# Patient Record
Sex: Male | Born: 1960 | Race: White | Hispanic: No | Marital: Single
Health system: Midwestern US, Academic
[De-identification: ages and names within clinical notes are randomized; demographics above are authoritative.]

---

## 2021-01-21 ENCOUNTER — Encounter: Admit: 2021-01-21 | Discharge: 2021-01-21

## 2021-01-21 ENCOUNTER — Inpatient Hospital Stay: Admit: 2021-01-21

## 2021-01-21 ENCOUNTER — Inpatient Hospital Stay: Admit: 2021-01-21 | Discharge: 2021-01-21

## 2021-01-21 ENCOUNTER — Emergency Department: Admit: 2021-01-21 | Discharge: 2021-01-21

## 2021-01-21 DIAGNOSIS — F411 Generalized anxiety disorder: Secondary | ICD-10-CM

## 2021-01-21 DIAGNOSIS — I10 Essential (primary) hypertension: Secondary | ICD-10-CM

## 2021-01-21 DIAGNOSIS — F4310 Post-traumatic stress disorder, unspecified: Secondary | ICD-10-CM

## 2021-01-21 DIAGNOSIS — I4891 Unspecified atrial fibrillation: Secondary | ICD-10-CM

## 2021-01-21 DIAGNOSIS — R079 Chest pain, unspecified: Secondary | ICD-10-CM

## 2021-01-21 LAB — COMPREHENSIVE METABOLIC PANEL
ALBUMIN: 4.2 g/dL (ref 3.5–5.0)
ALK PHOSPHATASE: 171 U/L — ABNORMAL HIGH (ref 25–110)
ALT: 35 U/L (ref 7–56)
ANION GAP: 13 K/UL — ABNORMAL HIGH (ref 3–12)
AST: 32 U/L (ref 7–40)
BLD UREA NITROGEN: 7 mg/dL (ref 7–25)
CALCIUM: 9.8 mg/dL — ABNORMAL HIGH (ref 8.5–10.6)
CO2: 25 MMOL/L (ref 21–30)
CREATININE: 0.7 mg/dL — ABNORMAL HIGH (ref 0.4–1.24)
EGFR: >60 mL/min — ABNORMAL HIGH (ref >60)
GLUCOSE,PANEL: 115 mg/dL — ABNORMAL HIGH (ref 70–100)
POTASSIUM: 4.2 MMOL/L — ABNORMAL LOW (ref 3.5–5.1)
SODIUM: 145 MMOL/L — ABNORMAL LOW (ref 137–147)
TOTAL BILIRUBIN: 0.6 mg/dL (ref 0.3–1.2)
TOTAL PROTEIN: 7.9 g/dL (ref 6.0–8.0)

## 2021-01-21 LAB — CBC AND DIFF
ABSOLUTE BASO COUNT: 0.1 K/UL (ref 0–0.20)
ABSOLUTE EOS COUNT: 0 K/UL (ref 0–0.45)
MCV: 91 FL (ref 80–100)
MDW (MONOCYTE DISTRIBUTION WIDTH): 15 (ref <20.7)
RBC COUNT: 3.9 M/UL — ABNORMAL LOW (ref 4.4–5.5)
WBC COUNT: 8.5 K/UL (ref 4.5–11.0)

## 2021-01-21 LAB — BARBITURATES-URINE RANDOM: BARBITURATES: NEGATIVE

## 2021-01-21 LAB — TROPONIN-I: TROPONIN I: 0 ng/mL (ref 0.0–0.05)

## 2021-01-21 LAB — PTT (APTT)
PTT: 30 s (ref 24.0–36.5)
PTT: 50 s — ABNORMAL HIGH (ref 24.0–36.5)

## 2021-01-21 LAB — COCAINE-URINE RANDOM: COCAINE: NEGATIVE

## 2021-01-21 LAB — PROTIME INR (PT)
INR: 1 (ref 0.8–1.2)
PROTIME: 10 s (ref 9.5–14.2)

## 2021-01-21 LAB — BENZODIAZEPINES-URINE RANDOM: BENZODIAZEPINES: POSITIVE — AB

## 2021-01-21 LAB — AMPHETAMINES-URINE RANDOM: AMPHETAMINES: NEGATIVE

## 2021-01-21 LAB — POC TROPONIN
TROPONIN I, POC: 0 ng/mL (ref 0.00–0.05)
TROPONIN I, POC: 0 ng/mL (ref 0.00–0.05)

## 2021-01-21 LAB — CANNABINOIDS-URINE RANDOM: CANNABINOIDS (THC): NEGATIVE

## 2021-01-21 LAB — TSH WITH FREE T4 REFLEX: TSH: 1.6 uU/mL (ref >40)

## 2021-01-21 LAB — D-DIMER: D-DIMER: 154 ng{FEU}/mL — ABNORMAL HIGH (ref <500)

## 2021-01-21 LAB — BNP (B-TYPE NATRIURETIC PEPTI): BNP: 505 pg/mL — ABNORMAL HIGH (ref <150)

## 2021-01-21 LAB — HEMOGLOBIN A1C: HEMOGLOBIN A1C: 7.3 % — ABNORMAL HIGH (ref <100)

## 2021-01-21 LAB — OPIATES-URINE RANDOM: OPIATES: NEGATIVE

## 2021-01-21 LAB — PHENCYCLIDINES-URINE RANDOM: PHENCYCLIDINE (PCP): NEGATIVE

## 2021-01-21 LAB — FENTANYL URINE: FENTANYL URINE: POSITIVE — AB

## 2021-01-21 LAB — LIPID PROFILE
CHOLESTEROL: 220 mg/dL — ABNORMAL HIGH (ref <200)
NON HDL CHOLESTEROL: 150 mg/dL
VLDL: 16 mg/dL

## 2021-01-21 MED ORDER — DILTIAZEM BOLUS FOR CONTINUOUS INFUSION
15 mg | Freq: Once | INTRAVENOUS | 0 refills | Status: CP
Start: 2021-01-21 — End: ?

## 2021-01-21 MED ORDER — METOPROLOL TARTRATE 25 MG PO TAB
25 mg | Freq: Two times a day (BID) | ORAL | 0 refills | Status: DC
Start: 2021-01-21 — End: 2021-01-21

## 2021-01-21 MED ORDER — SENNOSIDES-DOCUSATE SODIUM 8.6-50 MG PO TAB
1 | Freq: Every day | ORAL | 0 refills | Status: AC | PRN
Start: 2021-01-21 — End: ?

## 2021-01-21 MED ORDER — LORAZEPAM 1 MG PO TAB
1 mg | Freq: Once | ORAL | 0 refills | Status: CP
Start: 2021-01-21 — End: ?
  Administered 2021-01-21: 17:00:00 1 mg via ORAL

## 2021-01-21 MED ORDER — SERTRALINE 25 MG PO TAB
50 mg | Freq: Every day | ORAL | 0 refills | Status: AC
Start: 2021-01-21 — End: ?
  Administered 2021-01-21 – 2021-01-23 (×3): 50 mg via ORAL

## 2021-01-21 MED ORDER — ONDANSETRON HCL (PF) 4 MG/2 ML IJ SOLN
4 mg | INTRAVENOUS | 0 refills | Status: AC | PRN
Start: 2021-01-21 — End: ?

## 2021-01-21 MED ORDER — HEPARIN (PORCINE) INITIAL BOLUS FOR CONTINUOUS INF (BAG)
4000 [IU] | Freq: Once | INTRAVENOUS | 0 refills | Status: CP
Start: 2021-01-21 — End: ?

## 2021-01-21 MED ORDER — NITROGLYCERIN 0.4 MG SL SUBL
.4 mg | SUBLINGUAL | 0 refills | Status: AC | PRN
Start: 2021-01-21 — End: ?

## 2021-01-21 MED ORDER — RP DX TC-99M MAA MCI
5 | Freq: Once | INTRAVENOUS | 0 refills | Status: CP
Start: 2021-01-21 — End: ?
  Administered 2021-01-21: 14:00:00 5.1 via INTRAVENOUS

## 2021-01-21 MED ORDER — HYDROXYZINE HCL 25 MG PO TAB
25 mg | Freq: Three times a day (TID) | ORAL | 0 refills | Status: AC | PRN
Start: 2021-01-21 — End: ?
  Administered 2021-01-22 (×2): 25 mg via ORAL

## 2021-01-21 MED ORDER — HEPARIN (PORCINE) IN 5 % DEX 20,000 UNIT/500 ML (40 UNIT/ML) IV SOLP
0-2000 [IU]/h | INTRAVENOUS | 0 refills | Status: AC
Start: 2021-01-21 — End: ?
  Administered 2021-01-21: 18:00:00 870 [IU]/h via INTRAVENOUS
  Administered 2021-01-22: 07:00:00 1070 [IU]/h via INTRAVENOUS

## 2021-01-21 MED ORDER — RP DX TL-201 THALLOUS CHL MCI
1.25 | Freq: Once | INTRAVENOUS | 0 refills | Status: CP
Start: 2021-01-21 — End: ?
  Administered 2021-01-21: 16:00:00 1.26 via INTRAVENOUS

## 2021-01-21 MED ORDER — ASPIRIN 81 MG PO CHEW
324 mg | Freq: Once | ORAL | 0 refills | Status: CP
Start: 2021-01-21 — End: ?
  Administered 2021-01-21: 09:00:00 324 mg via ORAL

## 2021-01-21 MED ORDER — DILTIAZEM HCL 60 MG PO TAB
30 mg | ORAL | 0 refills | Status: AC
Start: 2021-01-21 — End: ?
  Administered 2021-01-21 – 2021-01-23 (×8): 30 mg via ORAL

## 2021-01-21 MED ORDER — HEPARIN (PORCINE) BOLUS FOR CONTINUOUS INF (BAG)
20-40 [IU]/kg | INTRAVENOUS | 0 refills | Status: AC
Start: 2021-01-21 — End: ?

## 2021-01-21 MED ORDER — REGADENOSON 0.4 MG/5 ML IV SYRG
.4 mg | Freq: Once | INTRAVENOUS | 0 refills | Status: CP
Start: 2021-01-21 — End: ?
  Administered 2021-01-21: 16:00:00 0.4 mg via INTRAVENOUS

## 2021-01-21 MED ORDER — NITROGLYCERIN 0.4 MG SL SUBL
.4 mg | Freq: Once | SUBLINGUAL | 0 refills | Status: CP
Start: 2021-01-21 — End: ?
  Administered 2021-01-21: 11:00:00 0.4 mg via SUBLINGUAL

## 2021-01-21 MED ORDER — CLONIDINE HCL 0.1 MG PO TAB
.1 mg | ORAL | 0 refills | Status: AC
Start: 2021-01-21 — End: ?
  Administered 2021-01-21 – 2021-01-22 (×3): 0.1 mg via ORAL

## 2021-01-21 MED ORDER — MELATONIN 3 MG PO TAB
6 mg | Freq: Every evening | ORAL | 0 refills | Status: AC
Start: 2021-01-21 — End: ?
  Administered 2021-01-22 – 2021-01-23 (×2): 6 mg via ORAL

## 2021-01-21 MED ORDER — EUCALYPTUS-MENTHOL MM LOZG
1 | Freq: Once | ORAL | 0 refills | Status: AC | PRN
Start: 2021-01-21 — End: ?

## 2021-01-21 MED ORDER — AMINOPHYLLINE 500 MG/20 ML IV SOLN
50 mg | INTRAVENOUS | 0 refills | Status: AC | PRN
Start: 2021-01-21 — End: ?

## 2021-01-21 MED ORDER — CLONIDINE HCL 0.1 MG PO TAB
.1 mg | Freq: Two times a day (BID) | ORAL | 0 refills | Status: AC
Start: 2021-01-21 — End: ?
  Administered 2021-01-22: 18:00:00 0.1 mg via ORAL

## 2021-01-21 MED ORDER — GABAPENTIN 300 MG PO CAP
900 mg | ORAL | 0 refills | Status: AC
Start: 2021-01-21 — End: ?
  Administered 2021-01-21 – 2021-01-23 (×7): 900 mg via ORAL

## 2021-01-21 MED ORDER — RP DX TL-201 THALLOUS CHL MCI
.5 | Freq: Once | INTRAVENOUS | 0 refills | Status: CP
Start: 2021-01-21 — End: ?
  Administered 2021-01-21: 19:00:00 0.49 via INTRAVENOUS

## 2021-01-21 MED ORDER — METOPROLOL TARTRATE 5 MG/5 ML IV SOLN
5 mg | Freq: Once | INTRAVENOUS | 0 refills | Status: DC
Start: 2021-01-21 — End: 2021-01-21

## 2021-01-21 MED ORDER — GABAPENTIN 300 MG PO CAP
300 mg | ORAL | 0 refills | Status: AC
Start: 2021-01-21 — End: ?

## 2021-01-21 MED ORDER — LORAZEPAM 1 MG PO TAB
.5 mg | Freq: Once | ORAL | 0 refills | Status: CP
Start: 2021-01-21 — End: ?
  Administered 2021-01-21: 09:00:00 0.5 mg via ORAL

## 2021-01-21 MED ORDER — ONDANSETRON 4 MG PO TBDI
4 mg | ORAL | 0 refills | Status: AC | PRN
Start: 2021-01-21 — End: ?

## 2021-01-21 MED ORDER — SODIUM CHLORIDE 0.9 % IV SOLP
250 mL | INTRAVENOUS | 0 refills | Status: AC | PRN
Start: 2021-01-21 — End: ?

## 2021-01-21 MED ORDER — POLYETHYLENE GLYCOL 3350 17 GRAM PO PWPK
1 | Freq: Every day | ORAL | 0 refills | Status: AC | PRN
Start: 2021-01-21 — End: ?

## 2021-01-21 MED ORDER — CLONIDINE HCL 0.1 MG PO TAB
.05 mg | Freq: Two times a day (BID) | ORAL | 0 refills | Status: AC
Start: 2021-01-21 — End: ?

## 2021-01-21 MED ORDER — ASPIRIN 325 MG PO TAB
325 mg | Freq: Once | ORAL | 0 refills | Status: DC
Start: 2021-01-21 — End: 2021-01-21

## 2021-01-21 MED ORDER — GABAPENTIN 300 MG PO CAP
600 mg | ORAL | 0 refills | Status: AC
Start: 2021-01-21 — End: ?

## 2021-01-21 MED ORDER — ALBUTEROL SULFATE 90 MCG/ACTUATION IN HFAA
2 | RESPIRATORY_TRACT | 0 refills | Status: AC | PRN
Start: 2021-01-21 — End: ?

## 2021-01-21 MED ORDER — THIAMINE MONONITRATE (VIT B1) 100 MG PO TAB
100 mg | Freq: Every day | ORAL | 0 refills | Status: AC
Start: 2021-01-21 — End: ?
  Administered 2021-01-21 – 2021-01-23 (×3): 100 mg via ORAL

## 2021-01-21 MED ORDER — MELATONIN 5 MG PO TAB
5 mg | Freq: Every evening | ORAL | 0 refills | Status: AC | PRN
Start: 2021-01-21 — End: ?

## 2021-01-21 MED ORDER — LORAZEPAM 1 MG PO TAB
.5 mg | Freq: Once | ORAL | 0 refills | Status: CP
Start: 2021-01-21 — End: ?
  Administered 2021-01-21: 10:00:00 0.5 mg via ORAL

## 2021-01-21 MED ORDER — FENTANYL CITRATE (PF) 50 MCG/ML IJ SOLN
50 ug | Freq: Once | INTRAVENOUS | 0 refills | Status: CP
Start: 2021-01-21 — End: ?

## 2021-01-21 MED ADMIN — FENTANYL CITRATE (PF) 50 MCG/ML IJ SOLN [3037]: 50 ug | INTRAVENOUS | @ 10:00:00 | Stop: 2021-01-21 | NDC 00409909412

## 2021-01-21 NOTE — ED Notes
Pt called RN d/t having chest pain. Pt stated he was having non-radiating substernal chest pain, 7/10. Denies SOB and nausea.

## 2021-01-21 NOTE — Consults
Inpatient Psychiatric Clinical Nurse Specialist- Initial Consult  Pt. Name: [name]  Room: GQ9169/45  LOS: 0      Reason for consult:  coping    Interventions that would be helpful: adding prozosin 1mg  HS to help him with his PTSD nightmares.  He went to an Alcohol detox program but didn't finish with the therapy. He told me he would go back and finish.  This service is not able to enter orders. Primary team is responsible for entering orders.      Summary   Patient is a 60 year old male with PMH of PTSD, HTN and hodgkin's lymphoma (s/p treatment x 2) who presents for evaluation of worsening anxiety. While in the ER he developed acute substernal chest pain associated with shortness of breath.    MSE: Patient alert and Ox3. Speech fluid. Thoughts lucid and w/o evidence of psychoses. Memory grossly intact. Patient cooperative and with appropriate eye contact. Mood calm with congruent affect. Insight, judgment, and impulse control sufficient. No SI/HI, plans, or intent endorsed at this time.     Atarax 25 mg TID prn anxiety  melatonin 5 mg HS prn  Sertraline 50 mg daily    I appreciate being invited to participate in this patient's care. Call or page if there are any concerns or questions.     [address] [facility]   [facility]  Office [phone]

## 2021-01-21 NOTE — ED Notes
Pt continues to have substernal chest pain. MD notified and verbal order given.
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
## 2021-01-21 NOTE — ED Notes
Internal medicine at bedside

## 2021-01-22 ENCOUNTER — Inpatient Hospital Stay: Admit: 2021-01-22 | Discharge: 2021-01-22

## 2021-01-22 ENCOUNTER — Encounter: Admit: 2021-01-22 | Discharge: 2021-01-22

## 2021-01-22 DIAGNOSIS — F4310 Post-traumatic stress disorder, unspecified: Secondary | ICD-10-CM

## 2021-01-22 DIAGNOSIS — I10 Essential (primary) hypertension: Secondary | ICD-10-CM

## 2021-01-22 DIAGNOSIS — I4891 Unspecified atrial fibrillation: Secondary | ICD-10-CM

## 2021-01-22 MED ORDER — PERFLUTREN LIPID MICROSPHERES 1.1 MG/ML IV SUSP
1-10 mL | Freq: Once | INTRAVENOUS | 0 refills | Status: CP | PRN
Start: 2021-01-22 — End: ?
  Administered 2021-01-22: 14:00:00 2 mL via INTRAVENOUS

## 2021-01-22 MED ADMIN — MAGNESIUM SULFATE IN D5W 1 GRAM/100 ML IV PGBK [166578]: 1 g | INTRAVENOUS | @ 16:00:00 | Stop: 2021-01-22 | NDC 00338170940

## 2021-01-22 MED ADMIN — MAGNESIUM SULFATE IN D5W 1 GRAM/100 ML IV PGBK [166578]: 1 g | INTRAVENOUS | @ 18:00:00 | Stop: 2021-01-22 | NDC 00338170940

## 2021-01-22 MED ADMIN — MAGNESIUM SULFATE IN D5W 1 GRAM/100 ML IV PGBK [166578]: 1 g | INTRAVENOUS | @ 17:00:00 | Stop: 2021-01-22 | NDC 00338170940

## 2021-01-22 MED ADMIN — ATORVASTATIN 40 MG PO TAB [77113]: 40 mg | ORAL | @ 20:00:00 | NDC 00904629261

## 2021-01-22 MED ADMIN — FUROSEMIDE 40 MG PO TAB [3295]: 40 mg | ORAL | @ 17:00:00 | NDC 51079007301

## 2021-01-23 ENCOUNTER — Encounter: Admit: 2021-01-23 | Discharge: 2021-01-23 | Payer: BC Managed Care – PPO

## 2021-01-23 MED ADMIN — PRAZOSIN 1 MG PO CAP [6468]: 1 mg | ORAL | @ 02:00:00 | NDC 00904702061

## 2021-01-23 MED ADMIN — ATORVASTATIN 40 MG PO TAB [77113]: 40 mg | ORAL | @ 13:00:00 | Stop: 2021-01-23 | NDC 00904629261

## 2021-01-23 MED FILL — SERTRALINE 50 MG PO TAB: 50 mg | ORAL | 90 days supply | Qty: 90 | Fill #1 | Status: CP

## 2021-01-23 MED FILL — FUROSEMIDE 20 MG PO TAB: 20 mg | 30 days supply | Qty: 30 | Fill #1 | Status: CP

## 2021-01-23 MED FILL — ATORVASTATIN 40 MG PO TAB: 40 mg | ORAL | 90 days supply | Qty: 90 | Fill #1 | Status: CP

## 2021-01-23 MED FILL — PRAZOSIN 1 MG PO CAP: 1 mg | ORAL | 30 days supply | Qty: 60 | Fill #1 | Status: CP

## 2021-01-23 MED FILL — GABAPENTIN 300 MG PO CAP: 300 mg | ORAL | 8 days supply | Qty: 45 | Fill #1 | Status: CP

## 2021-01-23 MED FILL — POTASSIUM CHLORIDE 10 MEQ PO TBTQ: 10 mEq | ORAL | 15 days supply | Qty: 30 | Fill #1 | Status: CP

## 2021-01-23 MED FILL — DILTIAZEM HCL 120 MG PO CP24: 120 mg | ORAL | 90 days supply | Qty: 90 | Fill #1 | Status: CP

## 2021-01-23 MED FILL — HYDROXYZINE HCL 25 MG PO TAB: 25 mg | ORAL | 30 days supply | Qty: 90 | Fill #1 | Status: CP

## 2021-01-30 ENCOUNTER — Encounter: Admit: 2021-01-30 | Discharge: 2021-01-30 | Payer: BC Managed Care – PPO

## 2021-02-02 ENCOUNTER — Encounter: Admit: 2021-02-02 | Discharge: 2021-02-02 | Payer: BC Managed Care – PPO

## 2021-02-02 NOTE — Progress Notes
2nd ER visit in 48 hours to [facility].   Pt presents with cp and has radiation to left arm.   Trop neg. Repeat will be in 3 hrs.   Hx afib and was not getting 120 extended release cardizem at rehab. Given in ER.    Pt was in ETOH rehab facility   5/15 [facility] hospital admit  Hr 96, 97%RA Vitals stable    Dr. Iver Nestle will call Darlene after looking at pt records and reviewing EKG that Darlene will send to him    Update:   Pt will be sent back to rehab center.

## 2022-06-19 IMAGING — CR [ID]
1 series · 1 of 1 positions shown · non-contrast
Comparison: none

02/02/21

PROCEDURE: UTSYW
HISTORY: Chest pain. Previous CXR 01/30/2021. TB
Comparison is made with a prior chest x-ray of 01/30/21.
This is a 1 view portable AP upright of the chest

[chest ap]
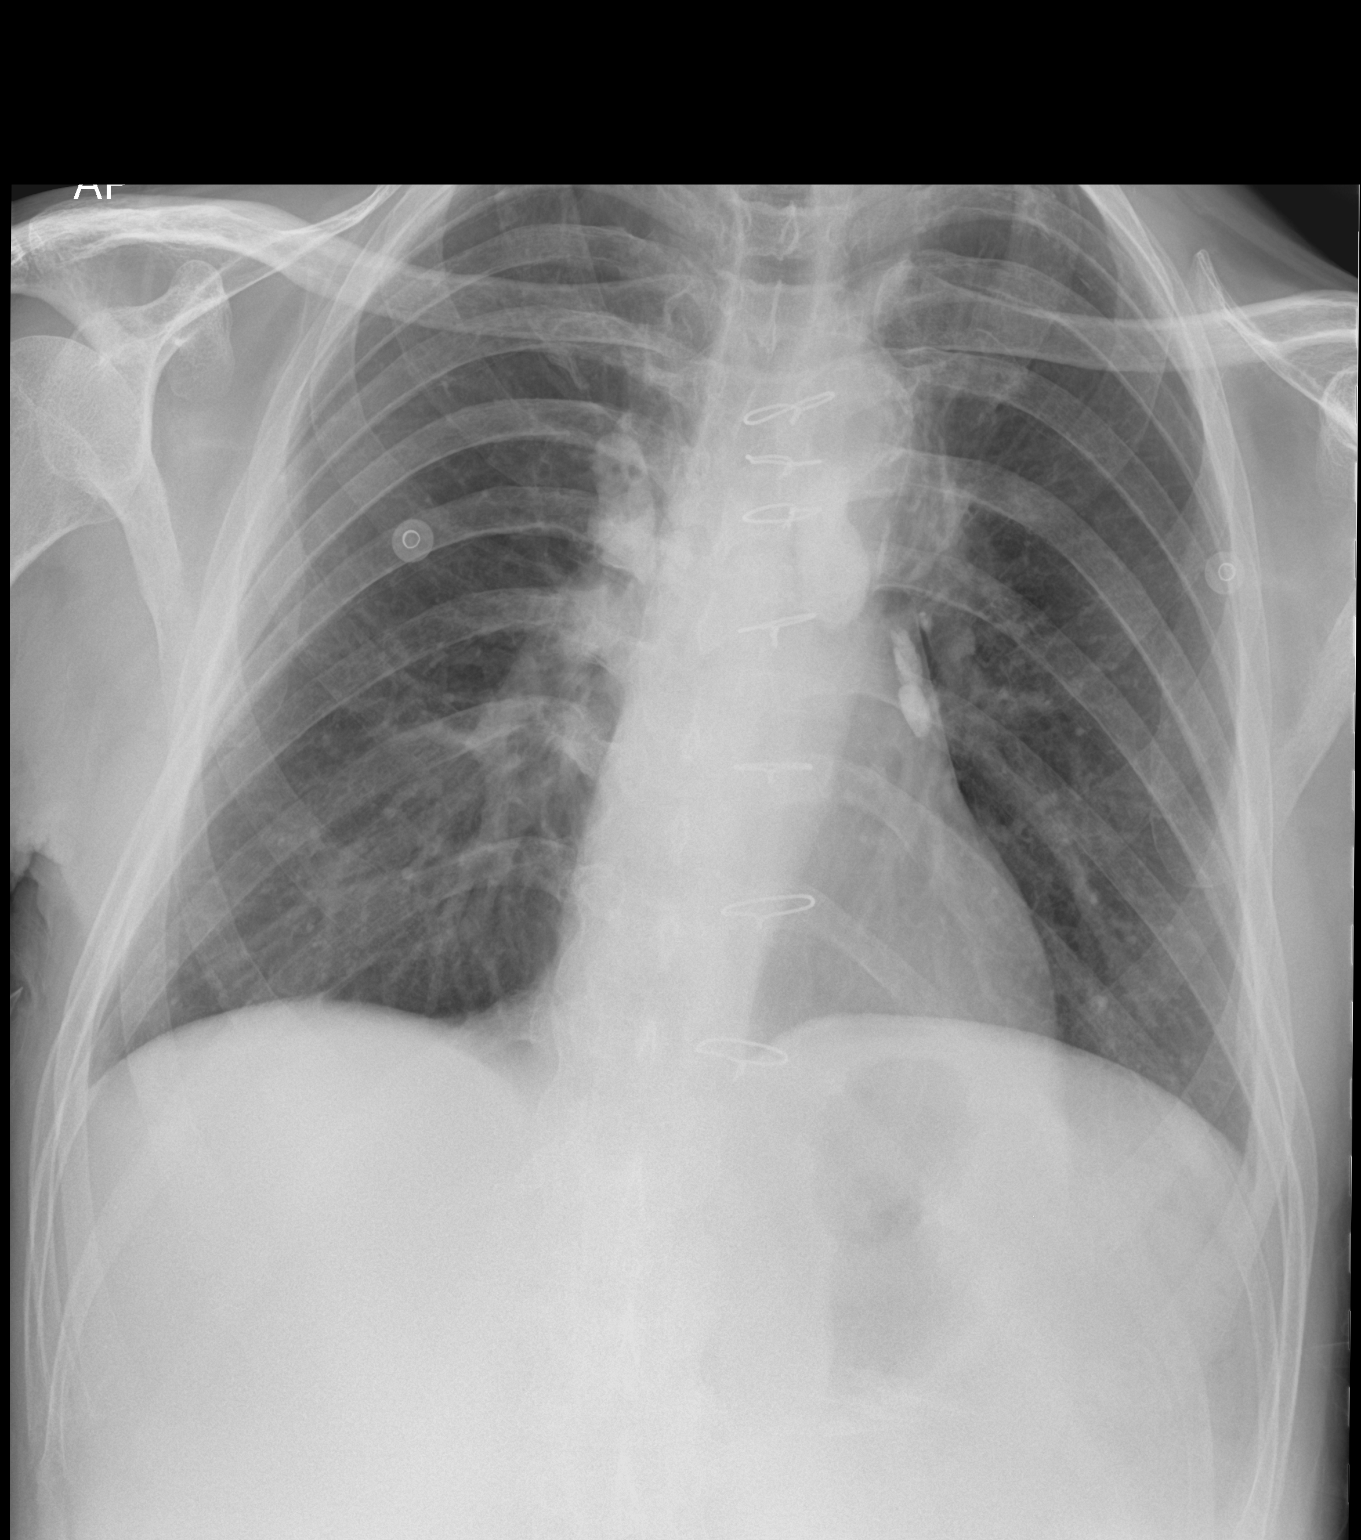

[1 of 1 positions shown; findings below may reference images not displayed]

FINDINGS: Calcifications in hilar nodes are identified consistent with old granulomatous disease.
There has been a median sternotomy.
The lung fields are clear of acute infiltrates.
The costophrenic angles are clear.
The heart is within the normal range.
The bony structures are unchanged from the prior study.
IMPRESSION: Evidence of old granulomatous disease unchanged from the prior study.
There is no radiographic evidence of acute disease in the chest.

Tech Notes:

Chest pain. Previous CXR 01/30/2021. TB

## 2023-06-22 ENCOUNTER — Encounter: Admit: 2023-06-22 | Discharge: 2023-06-22 | Payer: BC Managed Care – PPO

## 2023-06-23 ENCOUNTER — Encounter: Admit: 2023-06-23 | Discharge: 2023-06-23 | Payer: BC Managed Care – PPO

## 2023-07-16 ENCOUNTER — Encounter: Admit: 2023-07-16 | Discharge: 2023-07-16 | Payer: BC Managed Care – PPO

## 2023-07-16 ENCOUNTER — Ambulatory Visit: Admit: 2023-07-16 | Discharge: 2023-07-17 | Payer: BC Managed Care – PPO

## 2023-07-16 DIAGNOSIS — K317 Polyp of stomach and duodenum: Secondary | ICD-10-CM

## 2023-07-16 DIAGNOSIS — Z01812 Encounter for preprocedural laboratory examination: Secondary | ICD-10-CM

## 2023-07-16 NOTE — Patient Instructions
Recommendations:    - Schedule EGD/EMR.  The GI scheduling team will call you to schedule this appointment. If you do not hear from the scheduling team within 2 weeks, please call our office.       MyChart is our preferred method of communication and is the most efficient way to contact your healthcare team.     Please let me know if you have any questions or concerns.    Thank you,  Celene Skeen BSN, RN  Gastroenterology   [phone]    As part of the CARES act, starting December 08, 2019, some results are released to you automatically. Your provider will continue to send you a detailed result note on any labs that they order, but with these changes you may see your results before they do. Critical lab results will be addressed immediately, but otherwise please give your provider 72 hours (3 business days) to view and respond to your results before reaching out with any questions. Depending on your questions, they may ask you to schedule a telehealth or telephone visit to discuss further. This visit may be billed to your insurance depending on time and complexity.

## 2023-07-16 NOTE — Progress Notes
Telehealth Visit Note    Date of Service: 07/16/2023    Subjective:           [name] is a 62 y.o. male.    History of Present Illness    This is a 62 year old male who is been referred to [place] for duodenal polyps.  Patient has history of esophageal stricture most probably related to radiation therapy for Hodgkin's lymphoma.  He has had multiple dilations performed over the years.  On his most recent endoscopy that was performed for food bolus impaction there was a benign-appearing stricture seen at the distal esophagus but the scope could be passed through this into the stomach. He also has a PEG tube for nutrition.  The plan is to dilate the esophageal stricture sequentially.  However during the upper endoscopy there were at least 5 polyps detected in the duodenum.  These were present in the second and the third portion of duodenum.  The smaller 2 were removed.  The larger 3 that were ranging from 20 to 30 mm were not removed.    He does not take any antithrombotic agent.  His last colonoscopy was 5 to 6 years ago    Social history-he is single, has 1 child, does not smoke, quit alcohol 1 month ago and is not working    Family history - both father and brother had colon cancer at age 13 and 31 respectively.           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     Resolved Ambulatory Problems     Diagnosis Date Noted    No Resolved Ambulatory Problems     Past Medical History:   Diagnosis Date    Acid reflux 1990    Allergy 2000    Atrial fibrillation (HCC)     GERD (gastroesophageal reflux disease) 1990    Hypertension     Type II diabetes mellitus (HCC) 2020       Social History     Socioeconomic History    Marital status: Single   Tobacco Use    Smoking status: Never    Smokeless tobacco: Never   Vaping Use    Vaping status: Never Used   Substance and Sexual Activity    Alcohol use: Not Currently    Drug use: Never    Sexual activity: Not Currently       Family History   Problem Relation Name Age of Onset    Cancer-Colon Mother Father     Cancer-Colon Father Father     Cancer-Colon Brother Brother        Allergies   Allergen Reactions    Morphine ANAPHYLAXIS and BLISTERS       Patient Active Problem List    Diagnosis Date Noted    Atrial fibrillation with rapid ventricular response (HCC) 01/23/2021    PTSD (post-traumatic stress disorder) 01/23/2021    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 Take with  food.    atorvastatin (LIPITOR) 40 mg tablet Take one tablet by mouth daily.    dilTIAZem CD (CARDIZEM CD) 120 mg capsule Take one capsule by mouth daily.    furosemide (LASIX) 20 mg tablet Take one tablet every day as needed if you are noticing swelling in your legs or gaining water weight rapidly    hydrOXYzine HCL (ATARAX) 25 mg tablet Take one tablet by mouth three times daily as needed. For anxiety    magnesium oxide (MAGOX) 400 mg (241.3 mg magnesium) tablet Take one tablet by mouth three times daily.    potassium chloride SR (KLOR-CON M10) 10 mEq tablet Take two tablets by mouth daily. Take with a meal and a full glass of water. Take only when you take your Lasix pill    prazosin (MINIPRESS) 1 mg capsule Take one capsule by mouth at bedtime daily. Increase every 10 days if tolerating by 1 mg to a total of 3 mg every night. Further increases should be directed by your physicians at follow up appointmnets    sertraline (ZOLOFT) 50 mg tablet Take one tablet by mouth daily.    thiamine (VITAMIN B-1) 100 mg tablet Take one tablet by mouth daily.          Telehealth Patient Reported [name]       Row Name 07/16/23 0851                Pain Score Zero                      Computed Telehealth Body Mass Index unavailable. One or more values for this score either were not found within the given timeframe or did not fit some other criterion.    Physical Exam    Deferred as this was a telehealth visit     Assessment and Plan:  This is a 62 year old male who is been referred to [place] for duodenal polyps.  Patient has history of esophageal stricture most probably related to radiation therapy for Hodgkin's lymphoma.  He has had multiple dilations performed over the years.  On his most recent endoscopy that was performed for food bolus impaction there was a benign-appearing stricture seen at the distal esophagus but the scope could be passed through this into the stomach. He also has a PEG tube for nutrition.  The plan is to dilate the esophageal stricture sequentially.  However during the upper endoscopy there were at least 5 polyps detected in the duodenum.  These were present in the second and the third portion of duodenum.  The smaller 2 were removed.  These were tubular adenomas.  The larger 3 that were ranging from 20 to 30 mm were not removed.    He does not take any antithrombotic agent.  His last colonoscopy was 5 to 6 years ago    I discussed with the patient regarding management of duodenal adenomas.  I discussed about the natural history of duodenal adenomas as well as the risk of malignant transformation.  We discussed about endoscopic mucosal resection of duodenal adenomas.  Explained to the patient that this is a less risky way of removing these versus surgery.  The risk of the procedure include infection, bleeding, perforation, abdominal pain any of which could be serious or life-threatening and may need emergency surgery were also discussed.  Patient is agreeable and wants to proceed and will be scheduled for upper endoscopy.  We also discussed about the need for colonoscopy for  surveillance given his strong family history.  Patient will get this scheduled by his local gastroenterologist.  Thank you for allowing [place] to participate in his care and please feel free to call us if you have any questions.                         45 minutes spent on this patient's encounter with counseling and coordination of care taking >50% of the visit.

## 2023-08-11 ENCOUNTER — Encounter: Admit: 2023-08-11 | Discharge: 2023-08-11 | Payer: BC Managed Care – PPO

## 2023-08-13 ENCOUNTER — Encounter: Admit: 2023-08-13 | Discharge: 2023-08-13 | Payer: BC Managed Care – PPO

## 2023-08-18 ENCOUNTER — Encounter: Admit: 2023-08-18 | Discharge: 2023-08-18 | Payer: BC Managed Care – PPO

## 2023-08-19 ENCOUNTER — Ambulatory Visit: Admit: 2023-08-19 | Discharge: 2023-08-20 | Payer: BC Managed Care – PPO

## 2023-08-19 ENCOUNTER — Encounter: Admit: 2023-08-19 | Discharge: 2023-08-19 | Payer: BC Managed Care – PPO

## 2023-08-19 ENCOUNTER — Ambulatory Visit: Admit: 2023-08-19 | Discharge: 2023-08-19 | Payer: BC Managed Care – PPO

## 2023-08-20 ENCOUNTER — Encounter: Admit: 2023-08-20 | Discharge: 2023-08-20 | Payer: BC Managed Care – PPO

## 2023-08-26 ENCOUNTER — Encounter: Admit: 2023-08-26 | Discharge: 2023-08-26 | Payer: BC Managed Care – PPO

## 2023-08-28 ENCOUNTER — Ambulatory Visit: Admit: 2023-08-28 | Discharge: 2023-08-28 | Payer: BC Managed Care – PPO

## 2023-09-11 ENCOUNTER — Encounter: Admit: 2023-09-11 | Discharge: 2023-09-11 | Payer: BC Managed Care – PPO

## 2023-09-22 ENCOUNTER — Encounter: Admit: 2023-09-22 | Discharge: 2023-09-22 | Payer: BC Managed Care – PPO

## 2023-09-29 ENCOUNTER — Encounter: Admit: 2023-09-29 | Discharge: 2023-09-29 | Payer: BC Managed Care – PPO

## 2023-09-30 ENCOUNTER — Encounter: Admit: 2023-09-30 | Discharge: 2023-09-30 | Payer: BC Managed Care – PPO

## 2023-10-05 ENCOUNTER — Encounter: Admit: 2023-10-05 | Discharge: 2023-10-05 | Payer: BC Managed Care – PPO

## 2023-10-28 ENCOUNTER — Ambulatory Visit: Admit: 2023-10-28 | Discharge: 2023-10-29 | Payer: BC Managed Care – PPO

## 2023-10-28 ENCOUNTER — Encounter: Admit: 2023-10-28 | Discharge: 2023-10-28 | Payer: BC Managed Care – PPO

## 2023-11-12 ENCOUNTER — Encounter: Admit: 2023-11-12 | Discharge: 2023-11-12 | Payer: BC Managed Care – PPO

## 2023-11-12 NOTE — Telephone Encounter
 Pre procedure review  EGD with EMR - duodenal adenoma   Referring: Pearson Grippe  Seen in clinic      EGD 06/19/2023 with Dr Selena Batten

## 2023-11-13 ENCOUNTER — Ambulatory Visit: Admit: 2023-11-13 | Discharge: 2023-11-13 | Payer: BC Managed Care – PPO

## 2023-11-13 ENCOUNTER — Ambulatory Visit: Admit: 2023-11-13 | Discharge: 2023-11-14 | Payer: BC Managed Care – PPO

## 2023-11-13 ENCOUNTER — Encounter: Admit: 2023-11-13 | Discharge: 2023-11-13 | Payer: BC Managed Care – PPO

## 2023-11-13 MED ORDER — PROPOFOL INJ 10 MG/ML IV VIAL
INTRAVENOUS | 0 refills | Status: DC
Start: 2023-11-13 — End: 2023-11-13

## 2023-11-13 MED ORDER — ONDANSETRON HCL (PF) 4 MG/2 ML IJ SOLN
INTRAVENOUS | 0 refills | Status: DC
Start: 2023-11-13 — End: 2023-11-13

## 2023-11-13 MED ORDER — SUCCINYLCHOLINE CHLORIDE 20 MG/ML IJ SOLN
INTRAVENOUS | 0 refills | Status: DC
Start: 2023-11-13 — End: 2023-11-13

## 2023-11-13 MED ORDER — PROPOFOL 10 MG/ML IV EMUL 20 ML (INFUSION)(AM)(OR)
INTRAVENOUS | 0 refills | Status: DC
Start: 2023-11-13 — End: 2023-11-13
  Administered 2023-11-13: 21:00:00 180 ug/kg/min via INTRAVENOUS

## 2023-11-13 MED ADMIN — FENTANYL CITRATE (PF) 50 MCG/ML IJ SOLN [3037]: 25 ug | INTRAVENOUS | @ 22:00:00 | Stop: 2023-11-14 | NDC 00409909412

## 2023-11-13 MED ADMIN — FENTANYL CITRATE (PF) 50 MCG/ML IJ SOLN [3037]: 25 ug | INTRAVENOUS | @ 23:00:00 | Stop: 2023-11-14 | NDC 00409909412

## 2023-11-13 MED ADMIN — HALOPERIDOL LACTATE 5 MG/ML IJ SOLN [3584]: 1 mg | INTRAVENOUS | @ 22:00:00 | Stop: 2023-11-13 | NDC 63323047400

## 2023-11-13 MED ADMIN — LACTATED RINGERS IV SOLP [4318]: 1000 mL | INTRAVENOUS | @ 19:00:00 | Stop: 2023-11-13 | NDC 00338011704

## 2023-11-13 NOTE — Anesthesia Procedure Notes
 Procedure: Airway Placement    AIRWAY INSERTION    Date/Time: 11/13/2023 3:18 PM    Patient location: OR  Urgency: elective  Difficult Airway: No            Airway Procedure  Indication(s) for airway management: surgery        no    Preoxygenated: yes  Patient position: sniffing  Neck stabilization: no in-line stabilization    Mask difficulty assessment: 1 - vent by mask      Procedure Outcome  Final airway type: endotracheal airway  Endotracheal airway: ETT          ETT size (mm): 7.5  Technique used for successful ETT placement: direct laryngoscopy  Devices/methods used in placement: intubating stylet  Insertion site: oral  Blade type: Macintosh   Laryngoscope/Videolaryngoscope blade size: 3  Cormack-Lehane classification: grade I - full view of glottis      Measured from: teeth   Depth: 21 cm  Amount of Air in Cuff: 8 ml  Number of attempts at approach: 1  Placement verified by auscultation and capnometry                Performed by: Currie Paris, MD  Authorized by: Currie Paris, MD

## 2023-11-13 NOTE — Anesthesia Pre-Procedure Evaluation
 Anesthesia Pre-Procedure Evaluation    Name: [name]      MRN: [id number]     DOB: 18-Oct-1960     Age: 63 y.o.     Sex: male   _________________________________________________________________________     Procedure Info:   Procedure Information       Date/Time: 11/13/23 1430    Procedure: ESOPHAGOGASTRODUODENOSCOPY WITH ENDOSCOPIC MUCOSAL RESECTION - FLEXIBLE    Location: ENDO 1 / ENDO/GI    Surgeons: Vertell Novak, MD            Physical Assessment  Vital Signs (last filed in past 24 hours):  BP: 162/86 (03/07 1300)  Temp: 36.9 ?C (98.5 ?F) (03/07 1300)  Pulse: 98 (03/07 1300)  Respirations: 16 PER MINUTE (03/07 1300)  SpO2: 97 % (03/07 1300)  O2 Device: None (Room air) (03/07 1300)  Height: 180.3 cm (5' 11) (03/07 1300)  Weight: 74.8 kg (165 lb) (03/07 1300)     Glucose, POC   Date/Time Value Ref Range Status   11/13/2023 1302 159 (H) 70 - 100 mg/dL Final      Patient History   Allergies   Allergen Reactions    Morphine ANAPHYLAXIS and BLISTERS     Per pt, tolerates ALL other opioids        Current Medications    Medication Directions   aspirin EC (ASPIR-LOW) 81 mg tablet Take one tablet by mouth daily. Take with food.   carvediloL (COREG) 12.5 mg tablet Take one tablet by mouth twice daily with meals.   ELIQUIS 5 mg tablet Take one tablet by mouth twice daily.   furosemide (LASIX) 20 mg tablet Take one tablet every day as needed if you are noticing swelling in your legs or gaining water weight rapidly   gabapentin (NEURONTIN) 300 mg capsule Take one capsule by mouth twice daily as needed. Indications: neuropathic pain   potassium chloride (K-DUR) 10 mEq tablet Take one tablet by mouth as Needed (when taking Lasix).   sucralfate (CARAFATE) 1 gram tablet Take one tablet by mouth twice daily. Take on an empty stomach.       Review of Systems/Medical History      Patient summary reviewed  Pertinent labs reviewed    PONV Screening: Non-smoker and Postoperative opioids    No history of anesthetic complications    No family history of anesthetic complications      Airway         History of head/neck radiation      Pulmonary           Hx pulmonary embolus (bilateral 07/2023, eliquis)        No recent URI      Cardiovascular       Recent diagnostic studies:          echocardiogram      Exercise tolerance: >4 METS       Beta Blocker therapy: Yes      Beta blockers within 24 hours: Yes      Hypertension, well controlled          Valvular problems/murmurs (moderate TR):          Coronary artery disease        PTCA (2023 DES to diagonal of left main): drug-eluting stent          No palpitations          Dysrhythmias (remote hx ablation); atrial fibrillation    No angina      PVD (prn lasix  for edema)      CHF: hx HF per elevated LVEDP on cath, pt denies CHF knowledge.        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)      Sensory deficit (left eye blindness)        Psychiatric history (PTSD)          Anxiety      Musculoskeletal - negative          Endocrine/Other       Diabetes (08/2023 A1c 7.3, not on medication per pt preference), poorly controlled, type 2      Most recent Hgb A1C:7.2 - 9        Anemia (chronic mild anemia)      No history of blood transfusion        Malignancy (Hodgkin's lymphoma 1981, 1991 recurrence):    treated, radiation and chemotherapy      Not obese      Constitution - negative       Physical Exam    Airway Findings      Mallampati: I      TM distance: >3 FB      Neck ROM: full      Mouth opening: good    Dental Findings:       Upper dentures and lower dentures    Cardiovascular Findings:       Rhythm: regular      Rate: normal    Pulmonary Findings:       Breath sounds clear to auscultation.    Abdominal Findings:       Not obese    Neurological Findings:       Alert and oriented x 3       Previous Airway Procedure Notes Displaying the 3 most recent records   No records found.         Patient Lines/Drains/Airways Status       Active Lines:       Name Placement date Placement time Site Days    Peripheral IV 11/13/23 1313 Right Anterior;Proximal Forearm 22 G 11/13/23  1313  -- less than 1                  Diagnostic Tests  Hematology:   Lab Results   Component Value Date    HGB 12.8 (L) 11/13/2023    HCT 37.5 (L) 11/13/2023    PLTCT 438 (H) 11/13/2023    WBC 9.70 11/13/2023    NEUT 70.9 11/13/2023    ANC 6.90 11/13/2023    ALC 1.50 11/13/2023    MONA 8.6 11/13/2023    AMC 0.80 11/13/2023    EOSA 3.5 11/13/2023    ABC 0.10 11/13/2023    MCV 90.1 11/13/2023    MCH 30.8 11/13/2023    MCHC 34.2 11/13/2023    MPV 7.2 11/13/2023    RDW 15.3 (H) 11/13/2023         General Chemistry:   Lab  Results   Component Value Date    NA 138 01/23/2021    K 3.8 01/23/2021    CL 102 01/23/2021    CO2 27 01/23/2021    GAP 9 01/23/2021    BUN 9 01/23/2021    CR 0.69 01/23/2021    GLU 131 (H) 01/23/2021    CA 8.4 (L) 01/23/2021    ALBUMIN 3.2 (L) 01/23/2021    MG 1.5 (L) 01/23/2021    TOTBILI 0.5 01/23/2021      Coagulation:   Lab Results   Component Value Date    PT 10.8 01/21/2021    PTT 65.1 (H) 01/22/2021    INR 1.0 01/21/2021       PAC Plan    Anesthesia Plan    ASA score: 3   Plan: MAC  NPO status: acceptable      Informed Consent  Anesthetic plan and risks discussed with patient.        Plan discussed with: CRNA and surgeon/proceduralist.      Alerts

## 2023-11-13 NOTE — Anesthesia Post-Procedure Evaluation
 Post-Anesthesia Evaluation    Name: [name]      MRN: [id number]     DOB: 27-Feb-1961     Age: 63 y.o.     Sex: male   __________________________________________________________________________     Procedure Information       Anesthesia Start Date/Time: 11/13/23 1502    Procedures:       ESOPHAGOGASTRODUODENOSCOPY WITH ENDOSCOPIC MUCOSAL RESECTION - FLEXIBLE      ESOPHAGOGASTRODUODENOSCOPY WITH BIOPSY - FLEXIBLE    Location: ENDO 1 / ENDO/GI    Surgeons: Vertell Novak, MD            Post-Anesthesia Vitals  BP: 141/83 (03/07 1700)  Pulse: 95 (03/07 1700)  Respirations: 14 PER MINUTE (03/07 1700)  SpO2: 94 % (03/07 1700)  O2 Device: None (Room air) (03/07 1700)   Vitals Value Taken Time   BP 141/83 11/13/23 1700   Temp 36.3 ?C (97.3 ?F) 11/13/23 1545   Pulse 95 11/13/23 1700   Respirations 14 PER MINUTE 11/13/23 1700   SpO2 94 % 11/13/23 1700   O2 Device None (Room air) 11/13/23 1700   ABP     ART BP           Post Anesthesia Evaluation Note    Evaluation location: pre/post  Patient participation: recovered; patient participated in evaluation  Level of consciousness: alert    Pain score: 0  Pain management: adequate    Hydration: normovolemia  Temperature: 36.0?C - 38.4?C  Airway patency: adequate    Perioperative Events       Post-op nausea and vomiting: no PONV    Postoperative Status  Cardiovascular status: hemodynamically stable  Respiratory status: spontaneous ventilation  Follow-up needed: none    Perioperative Events  There were no known complications for this encounter.

## 2023-11-15 ENCOUNTER — Encounter: Admit: 2023-11-15 | Discharge: 2023-11-15 | Payer: BC Managed Care – PPO

## 2023-11-18 ENCOUNTER — Encounter: Admit: 2023-11-18 | Discharge: 2023-11-18 | Payer: BC Managed Care – PPO

## 2023-12-18 ENCOUNTER — Encounter: Admit: 2023-12-18 | Discharge: 2023-12-18 | Payer: BLUE CROSS/BLUE SHIELD

## 2024-03-03 ENCOUNTER — Encounter: Admit: 2024-03-03 | Discharge: 2024-03-03 | Payer: BLUE CROSS/BLUE SHIELD

## 2024-03-03 DIAGNOSIS — I63512 Cerebral infarction due to unspecified occlusion or stenosis of left middle cerebral artery: Principal | ICD-10-CM

## 2024-03-03 MED ORDER — MAGNESIUM SULFATE IN D5W 1 GRAM/100 ML IV PGBK
1 g | INTRAVENOUS | 0 refills | Status: DC | PRN
Start: 2024-03-03 — End: 2024-03-05
  Administered 2024-03-04 (×4): 1 g via INTRAVENOUS

## 2024-03-03 MED ORDER — INSULIN ASPART 100 UNIT/ML SC FLEXPEN
0-6 [IU] | Freq: Every day | SUBCUTANEOUS | 0 refills | Status: DC
Start: 2024-03-03 — End: 2024-03-04
  Administered 2024-03-04: 16:00:00 1 [IU] via SUBCUTANEOUS

## 2024-03-03 MED ORDER — DOCUSATE SODIUM 100 MG PO CAP
100 mg | Freq: Two times a day (BID) | ORAL | 0 refills | Status: DC
Start: 2024-03-03 — End: 2024-03-05

## 2024-03-03 MED ORDER — POTASSIUM CHLORIDE IN WATER 10 MEQ/50 ML IV PGBK
10 meq | INTRAVENOUS | 0 refills | Status: DC | PRN
Start: 2024-03-03 — End: 2024-03-05

## 2024-03-03 MED ORDER — SODIUM CHLORIDE 0.9% IV SOLP
INTRAVENOUS | 0 refills | Status: DC
Start: 2024-03-03 — End: 2024-03-05
  Administered 2024-03-04: 17:00:00 1000.0000 mL via INTRAVENOUS

## 2024-03-03 MED ORDER — POTASSIUM CHLORIDE 20 MEQ/15 ML PO LIQD
40-60 meq | NASOGASTRIC | 0 refills | Status: DC | PRN
Start: 2024-03-03 — End: 2024-03-05
  Administered 2024-03-04 – 2024-03-05 (×2): 40 meq via NASOGASTRIC

## 2024-03-03 MED ORDER — ONDANSETRON HCL (PF) 4 MG/2 ML IJ SOLN
4 mg | INTRAVENOUS | 0 refills | Status: DC | PRN
Start: 2024-03-03 — End: 2024-03-05

## 2024-03-03 MED ORDER — ACETAMINOPHEN 325 MG PO TAB
650 mg | ORAL | 0 refills | Status: DC | PRN
Start: 2024-03-03 — End: 2024-03-05
  Administered 2024-03-04 – 2024-03-05 (×4): 650 mg via ORAL

## 2024-03-03 MED ORDER — POTASSIUM CHLORIDE 20 MEQ PO TBTQ
40-60 meq | ORAL | 0 refills | Status: DC | PRN
Start: 2024-03-03 — End: 2024-03-05

## 2024-03-03 MED ORDER — SENNOSIDES-DOCUSATE SODIUM 8.6-50 MG PO TAB
1 | Freq: Two times a day (BID) | ORAL | 0 refills | Status: DC
Start: 2024-03-03 — End: 2024-03-05

## 2024-03-03 MED ORDER — DEXTROSE 50 % IN WATER (D50W) IV SYRG
12.5-25 g | INTRAVENOUS | 0 refills | Status: DC | PRN
Start: 2024-03-03 — End: 2024-03-05

## 2024-03-03 MED ORDER — MAGNESIUM HYDROXIDE 400 MG/5 ML PO SUSP
30 mL | Freq: Every day | ORAL | 0 refills | Status: DC
Start: 2024-03-03 — End: 2024-03-05

## 2024-03-03 MED ORDER — CALCIUM GLUC IN NACL, ISO-OSM 1 GRAM/50 ML IV SOLN
1 g | INTRAVENOUS | 0 refills | Status: DC | PRN
Start: 2024-03-03 — End: 2024-03-05
  Administered 2024-03-05: 11:00:00 1 g via INTRAVENOUS

## 2024-03-03 NOTE — Acute Stroke Response
 Name: [name]   MRN: [id number]     DOB: February 27, 1961      Age: 63 y.o.  Admission Date: 03/03/2024     LOS: 0 days     Date of Service: 03/03/2024       Allergies:  Morphine    Type of Acute Stroke Response Team note: Consult    Stroke Activation Tier: Tier 1     Assessment & Plan    Chief Complaint: Aphasia  Assessment: [name] is a 63 y.o. male with h/o EtOH use disorder, Afib with RVR (01/2021) s/p ablation, CAD s/p stent, T2DM, Hodgkin's lymphoma, HTN, NSTEMI, pneumothorax, Aflutter, PE presented as transfer from outside hospital for stroke. He received TNK at OSH prior to transfer. Initial NIH at OSH was 2 for aphasia. Imaging at OSH with L mid-M2 occlusion. Last known well 1430. Patient is usually on Eliquis, but this was recently held for upcoming procedure.    Repeat NIHSS on arrival was 1 for mild aphasia.    Impression: L M2 stroke    Suspected localization of Stroke Sx: L M2    Suspected etiology: Cardio Embolism    Pre-event Modified Rankin Scale (mRS): 2 - Slight disability; unable to carry out all previous activities, but able to look after own affairs without assistance     Plan:   - Admit to neuro-ICU   - MRI head without contrast  - Stroke risk factor assessment (will defer ordering to neuro-ICU)   Labs to include FLP, A1c   Echocardiogram    Telemetry to monitor for arrhythmia    Evaluation of smoking history and smoking cessation consult if tobaccoism present   - CBC, BMP routine   - PT/OT/SLP consult eval and treat   - Rehab consult for assessment of post stroke care     Patient was seen and staffed with Dr. Lavon.  Kennyth CHRISTELLA Carina, MD  Child neurology resident, PGY-3  Available on Voalte    History of Present Illness    On Eliquis for A-fib, but this was recently held for upcoming procedure. Presented to OSH for aphasia. NIHSS on arrival to OSH was 2. He received TNK at OSH before transfer. Transferred for neuro-ICU observation after TNK and possible EVT. With NIHSS improved on arrival to [facility], will not proceed with EVT.    Review of Systems    All other systems reviewed and are negative.    Stroke Activation Summary       ASRT Arrival: 2140  Location of Response : [facility] 5123 (transfer not paged out met pt in room)   Page Received:  (transfer was never paged out)    Clinical Presentation: Aphasia    Signs & Symptoms: Last Known Well  Last Known Well - Date: 03/03/24  Last Known Well - Time: 1429     CT/CTP/CTA:   Imaging from OSH not available       BP: 174/88 (06/26 2126)  Temp: 36.8 ?C (98.3 ?F) (06/26 2126)  Pulse: 93 (06/26 2126)  SpO2: 97 % (06/26 2126)  O2 Device: None (Room air) (06/26 2126)  Height: 180.3 cm (5' 11) (06/26 2126)    NIHSS Completed at: 2145      NIH Stroke Scale Item  Scoring Definition  Score    1a. LOC  0=alert and responsive   1=arousable to minor stimulation   2=arousable only to painful stimulation   3=reflex responses or unrousable  0    1b. LOC questions-as patient?s age and  month. Must be exact.  0=both correct   1=one correct (or dysarthria, intubated, foreign language)   2=neither correct  0    1c. Commands-open/close eyes, grip and release non-paretic hand (other 1 step commands or mimic OK)  0=both correct (ok if impaired by weakness)   1=one correct   2=neither correct  0    2. Best Gaze-horizontal EOM by voluntary or Doll?s  0=normal   1=partial gaze palsy (abnormal gaze in one or both eyes)   2=forced eye deviation or total paresis which cannot be overcome by Doll?s  0    3. Visual Field-use visual threat if necessary. If monocular, score field of good eye  0=no visual loss   1=partial hemianopia, quadrantanopia, extinction   2=complete hemianopia   3=bilateral hemianopia or blindness  0    4. Facial Palsy-if stuporous, check symmetry of grimace to pain  0=normal   1=minor paralysis, flat NLF, asymm smile   2=partial paralysis (lower face=UMN)   3=complete paralysis (upper and lower face)  0    5. Motor Arm-arms outstretched 90 deg (sitting) or 45 deg (supine) for 10 seconds. Encourage best effort.  0=no drift x 10 seconds   1=drift but doesn?t hit bed   2=some antigravity effort, but can?t sustain   3=no antigravity effort, but even minimal mvt counts   4=no movement at all   X=unable to assess due to amputation, fusion, etc  L/R   0/0    6. Motor Leg-raise leg to 30 degrees supine x 5 seconds  0=no drift x 5 seconds   1=drift but doesn?t hit bed   2=some antigravity effort, but can?t sustain   3=no antigravity effort, but even minimal mvt counts   4=no movement at all   X=unable to assess due to amputation, fusion, etc  L/R   0/0    7. Limb Ataxia-check finger-nose- finger; heel-shin; and score only if out of proportion to paralysis  0=no ataxia (or aphasic, hemiplegic)   1=ataxia in upper or lower extremity   2=ataxia in upper AND lower extremity   X=unable to assess due to amputation, fusion, etc  0   8. Sensory-use safety pin. Check grimace or withdrawal if stuporous. Score only stroke- related losses  0=normal   1=mild-mod unilateral loss but patient aware of touch or aphasic, confused)   2=total loss, pt unaware of touch. Coma, bilateral loss  0   9. Best Language-describe cookie jar picture, name objects, read sentences. May use repeating, writing, stereognosis  0=normal   1=mild-mod aphasia (diff but partly comprehensible)   2=severe aphasia (almost no info exchanged)   3=mute, global aphasia, coma. No 1 step commands  1    10. Dysarthria-read list of words  0=normal or intubated or other physical barrier  1=mild-mod; slurred but intelligible   2=severe; unintelligible or mute  0    11. Extinction/Neglect- simultaneously touch patient on both hands, show fingers in both visual fields, ask about deficit, left hand  0=normal, none detected. (visual loss alone)   1=neglects or extinguishes to double stimulation in any modality   2=profound neglect in more than one modality  0    Score    1     Was the patient 4.5-9 hrs from last known well or a wake-up stroke? No    Was IV tenecteplase given? Started at outside hospital    The patient was not a thrombectomy candidate due to occlusion in L mid-M2, too distal for thrombectomy  Dysphagia screen:  Performed by nursing staff and passed         Health History    Past Medical History:    Acid reflux    Allergy    Anemia    Atrial fibrillation (CMS-HCC)    CAD (coronary artery disease)    CHF (congestive heart failure) (CMS-HCC)    Diabetes (CMS-HCC)    GERD (gastroesophageal reflux disease)    Heart valve problem    Hodgkin's lymphoma (CMS-HCC)    Hx pulmonary embolism    Hypertension    Neuropathy    Past myocardial infarction    PTSD (post-traumatic stress disorder)    PVD (peripheral vascular disease)    Type II diabetes mellitus (CMS-HCC)     Surgical History:   Procedure Laterality Date    CARDIAC CATHERIZATION  09/2021    DES to Diag LAD    ESOPHAGOGASTRODUODENOSCOPY WITH ENDOSCOPIC MUCOSAL RESECTION - FLEXIBLE N/A 11/13/2023    Performed by Rogers Kipper, MD at [facility] ENDO    ESOPHAGOGASTRODUODENOSCOPY WITH BIOPSY - FLEXIBLE N/A 11/13/2023    Performed by Rogers Kipper, MD at [facility] ENDO    ABDOMINAL EXPLORATION SURGERY  1981    Hodgkins    ATRIAL ABLATION SURGERY      COLONOSCOPY      ESOPHAGEAL DILATATION      GASTROSTOMY TUBE PLACEMENT      HX APPENDECTOMY  1981    HX CHOLECYSTECTOMY      HX ENDOSCOPY  1995    Partial pancreas    PANCREATECTOMY      PERICARDIUM SURGERY      UPPER GASTROINTESTINAL ENDOSCOPY       Family History   Problem Relation Name Age of Onset    Cancer-Colon Mother Father     Cancer-Colon Father Father     Cancer-Colon Brother Brother      Social History     Socioeconomic History    Marital status: Single   Tobacco Use    Smoking status: Never    Smokeless tobacco: Never   Vaping Use    Vaping status: Never Used   Substance and Sexual Activity    Alcohol use: Not Currently     Comment: last drink fall 2024    Drug use: Never    Sexual activity: Not Currently              Medications:  docusate (COLACE) capsule 100 mg, 100 mg, Oral, BID  insulin aspart (U-100) (NOVOLOG FLEXPEN U-100 INSULIN) injection PEN 0-6 Units, 0-6 Units, Subcutaneous, 5 X Daily  [START ON 03/04/2024] milk of magnesium  oral suspension 30 mL, 30 mL, Oral, QDAY  NICARDIPINE 25 MG/10 ML IV SOLN (Cabinet Override), , , NOW  sennosides-docusate sodium  (SENOKOT-S) tablet 1 tablet, 1 tablet, Oral, BID        PRN Medications:  acetaminophen Q4H PRN, calcium gluconate  IV PRN (On Call from Rx) **AND** Ionized Calcium PRN **AND** Notify Physician Ongoing, dextrose 50% (D50) IV PRN, magnesium  sulfate PRN **AND** Magnesium  PRN **AND** Notify Physician Ongoing, ondansetron  (ZOFRAN ) IV Q6H PRN, potassium chloride  PRN **OR** potassium chloride  PRN **OR** potassium chloride  in water PRN       Physical Exam    HEENT: normocephalic, eyes open with no discharge, nares patent, oropharynx is clear with no lesions, palate intact  CV: well perfused throughout  Chest: normal configuration, equal chest rise bilaterally, no increased work of breathing  Skin: no rashes or lesions    Extended Neuro Exam:  Mental status: alert, oriented to person/place/time  Speech:    Normal Abnormal   Fluency x Occasional mild word-finding difficulty   Comprehension x    Articulation x    Repetition x    Naming x        Cranial Nerves:    Normal Abnormal   II x    III, IV, VI x    V x    VII                         x                             VIII x    IX, X x    XI x    XII x        Muscle/motor:   Tone: nml  Bulk: nml  Fasciculations: none     SA EF EE WE WF FF FE FA TA HF HA HE KF KE DF PF   R 5 5 5 5  5 5 5  5   5 5 5 5    L 5 5 5 5  5 5 5  5   5 5 5 5        Sensation:    Normal RUE LUE RLE LLE   Light Touch x       Pin Prick x       Temperature x       Vibration x       Proprioception            Coordination:    Normal Abnormal Right Abnormal Left   Finger to Nose x     Rapid alternating       Heel to Shin x     Finger tap      Foot tap        Gait and Station:  Deferred    Reflexes:    Right Left   Triceps 2 2   Biceps 2 2   Brachioradialis 2 2   Patella 2 2   Ankle 2 2   Plantar down down          Lab/Radiology/Other Diagnostic Tests:  24-hour labs:    Results for orders placed or performed during the hospital encounter of 03/03/24 (from the past 24 hours)   IONIZED CALCIUM    Collection Time: 03/03/24  9:43 PM   Result Value Ref Range    Ionized Calcium 1.12 1.00 - 1.30 mmol/L        Pertinent radiology reviewed.

## 2024-03-03 NOTE — H&P (View-Only)
 Neuro Critical Care History and Physical Note       [name]  Admission Date: (Not on file)  LOS: 0 days  Prior                      ASSESSMENT/PLAN     Patient Active Problem List    Diagnosis Date Noted    Atrial fibrillation with rapid ventricular response (CMS-HCC) 01/23/2021    PTSD (post-traumatic stress disorder) 01/23/2021    Tricuspid regurgitation 01/23/2021    Chest pain 01/21/2021    Primary hypertension 01/21/2021    Chest pain 01/21/2021       [name] is a 63 y.o. male with a PMH of PTSD, EtOH abuse, HTN, CAD, Afib w/ RVR, PE on eliquis, spontaneous pneumothorax, Hodgkin's Lymphoma and T2DM who presented to [facility] when developed expressive aphasia while at work. LKW 1430 on 6/26 with an NIH of 2. Imaging demonstrated a left M2 occlusion and was given TNK at 1823. He was subsequently transferred to [facility] for further evaluation and management.      Hospital and ICU course:   6/26: Admitted to NEICU. Stroke work-up intitated    Neuro:   Acute Left M2 Occlusion  S/P TNK  Aphasia    Stroke Symptom Onset time: 1430 on 03/03/24  TPA given at: 1823 on 03/03/24  Initial NIH: 2  NIH post TPA: 1  IR: No  Follow up Imaging:  MRI wo    Ischemic Stroke Risk Factors    Risk factor Present?  Target Patient at target? Comments   1. Hypertension Yes BP <180/105  Yes    2. Diabetes  Yes  HBA1C<7  Pending    3. Dyslipidemia Yes LDL<70? Pending    4. H/o stroke/TIA No      5. Atrial fibrillation Yes         If yes, anticoag? Yes, Eliquis         If no anticoagulation, why not?       6. Tobacco abuse No      Quit date  N/A        - If afib/flutter present, is patient on anticoagulation? Yes/no    - if not, why not?    Plan:   - MRI wo  - low threshold to restroke activate for possible EVT  - Anti-platelet treatment pending MRI  - Rehab Medicine, PT, OT, SLP consulted   - Neuro-ICU monitoring, neurochecks q 1 hrs    Imaging:  CT Head @ OSH  - Negative for acute intracranial abnormality    CTA Head/Neck @ OSH  - Occlusion of left MCA.     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)  - Feeding: NPO pending RN bedside  swallow eval  - neuro bowel regimen, ensure daily BM  Lab Results   Component Value Date    AST 15 01/23/2021    ALT 17 01/23/2021    ALKPHOS 130 (H) 01/23/2021       Heme/Onc:  H/O Hodgkin's Lymphoma   - Daily CBC  - VTE prophylaxis: Mechanical prophylaxis; Sequential compression device  - assess for coagulopathy, maintain platelets above 100k, INR <1.5  Lab Results   Component Value Date    HGB 12.8 (L) 11/13/2023    HCT 37.5 (L) 11/13/2023    PLTCT 438 (H) 11/13/2023    MCV 90.1 11/13/2023    MCHC 34.2 11/13/2023    PT 10.8 01/21/2021    INR 1.0 01/21/2021    PTT 65.1 (H) 01/22/2021     ID:   - Tmax 36.8   - aim for normothermia, Temp <38.3 celsius, normothermia protocol if febrile  Lab Results   Component Value Date    WBC 9.70 11/13/2023     Renal:   - Monitor hourly I/O balance   - Aim for normovolemia  Lab Results   Component Value Date    CR 0.69 01/23/2021    BUN 9 01/23/2021    CO2 27 01/23/2021    GAP 9 01/23/2021     Endocrine:    T2DM  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)  - Blood glucose goal 100-180mg /dl  - [id number] pending  Lab Results   Component Value Date    GLU 131 (H) 01/23/2021       FEN:   - IVF: NS @ 75 ml/h  - Daily BMP, Mag, Phos & iCal  - Critical care electrolyte replacement protocol  - Magnesium  goal >2.0, i-Cal goal > 1.0, Potassium goal >4.0 mEq/L  Lab Results   Component Value Date NA 138 01/23/2021    K 3.8 01/23/2021    MG 1.5 (L) 01/23/2021       Prophylaxis Review:   A) GI: None  B) Lines:  None  C) Urinary Catheter:  None  D) Antibiotic Usage:  None  E) VTE:  Mechanical prophylaxis; Sequential compression device  F) Isolation: None  G)Seizures: None  H) Restraints: Patient assessed for need for restraints.   I) Disposition/Family:   Needs ICU     Primary service: NCC    Consults:  PT, OT    ___________________________________________________________________  SUBJECTIVE   Chief Complaint:  Aphasia    History of Present Illness: [name] is a 63 y.o. male with a PMH of PTSD, EtOH abuse, HTN, CAD, Afib w/ RVR, PE on eliquis, spontaneous pneumothorax, Hodgkin's Lymphoma and T2DM who presented to [facility] when developed expressive aphasia while at work. LKW 1430 on 6/26 with an NIH of 2. Imaging demonstrated a left M2 occlusion and was given TNK at 1823. He was subsequently transferred to [facility] for further evaluation and management.      Past Medical History:    Acid reflux    Allergy    Anemia    Atrial fibrillation (CMS-HCC)    CAD (coronary artery disease)    CHF (congestive heart failure) (CMS-HCC)    Diabetes (CMS-HCC)    GERD (gastroesophageal reflux disease)    Heart valve problem    Hodgkin's lymphoma (CMS-HCC)    Hx pulmonary embolism    Hypertension    Neuropathy    Past myocardial infarction    PTSD (post-traumatic stress disorder)    PVD (peripheral vascular disease)    Type II diabetes  mellitus (CMS-HCC)       Surgical History:   Procedure Laterality Date    CARDIAC CATHERIZATION  09/2021    DES to Diag LAD    ESOPHAGOGASTRODUODENOSCOPY WITH ENDOSCOPIC MUCOSAL RESECTION - FLEXIBLE N/A 11/13/2023    Performed by Rogers Kipper, MD at [facility] ENDO    ESOPHAGOGASTRODUODENOSCOPY WITH BIOPSY - FLEXIBLE N/A 11/13/2023    Performed by Rogers Kipper, MD at [facility] ENDO    ABDOMINAL EXPLORATION SURGERY  1981    Hodgkins    ATRIAL ABLATION SURGERY      COLONOSCOPY      ESOPHAGEAL DILATATION GASTROSTOMY TUBE PLACEMENT      HX APPENDECTOMY  1981    HX CHOLECYSTECTOMY      HX ENDOSCOPY  1995    Partial pancreas    PANCREATECTOMY      PERICARDIUM SURGERY      UPPER GASTROINTESTINAL ENDOSCOPY         Family History   Problem Relation Name Age of Onset    Cancer-Colon Mother Father     Cancer-Colon Father Father     Cancer-Colon Brother Brother        Social History     Social History Narrative    Not on file       Code Status: Full Code    Decision Maker: [name] (daughter)     Immunizations (includes history and patient reported):   There is no immunization history on file for this patient.        Allergies:  Morphine    Medications Prior to Admission   Medication Sig    aspirin  EC (ASPIR-LOW) 81 mg tablet Take one tablet by mouth daily. Take with food.    carvediloL (COREG) 12.5 mg tablet Take one tablet by mouth twice daily with meals.    ELIQUIS 5 mg tablet Take one tablet by mouth twice daily.    furosemide  (LASIX ) 20 mg tablet Take one tablet every day as needed if you are noticing swelling in your legs or gaining water weight rapidly    gabapentin  (NEURONTIN ) 300 mg capsule Take one capsule by mouth twice daily as needed. Indications: neuropathic pain    potassium chloride  (K-DUR) 10 mEq tablet Take one tablet by mouth as Needed (when taking Lasix ).    sucralfate (CARAFATE) 1 gram tablet Take one tablet by mouth twice daily. Take on an empty stomach.       Review of Systems:  A 14 point review of systems was negative except for: Neurological: positive for speech problems      OBJECTIVE                     Vital Signs: Last Filed                  Vital Signs: 24 Hour Range      BP: ()/()   ABP: ()/()     Intensity Pain Scale (Self Report): (not recorded) There were no vitals filed for this visit.      Artificial airway:  None                Lines:  Peripheral Line  Drains: None      Intake/Output Summary:  (Last 24 hours)  No intake or output data in the 24 hours ending 03/03/24 2140         Physical Exam:    Temperature 36.8 ?C (98.3 ?F).    Glasgow coma score:  E: 4 - Opens eyes on own          M: 6 - Follows simple motor commands              V: 5 - Alert and oriented    Neuro:   Mental Status: A&O x 4 with options, mild aphasia   Cranial Nerves: Cranial nerves 2-12 INTACT by inspection.      - Pupil exam: Size:         3 R/L               Reactivity: Brisk    - EOM: Intact    Motor:         RUE: Strength: 5/5; FC                     RLE: Strength: 5/5; FC            LUE: Strength: 5/5; FC         LLE: Strength: 5/5; FC   Sensory: normal      Lungs: clear to auscultation bilaterally                     Pulmonary:  Airway status stable on RA   Heart: regular rate and rhythm, S1, S2 normal, no murmur, click, rub or gallop  Abdomen: soft, non-tender. Bowel sounds normal. No masses,  no organomegaly  Extremities: extremities normal, atraumatic, no cyanosis or edema  Skin: Skin color, texture, turgor normal. No rashes or lesions    Point of Care Testing:  (Last 24 hours):       Lab Review:  Pertinent labs reviewed    Radiology and Other Diagnostic Procedures Review:  Pertinent radiologic and diagnostic procedures reviewed.      I spent 45 minutes managing the care of this patient. [name] is in critical condition with acute left MCA stroke s/p thromblytics requring close neurological and hemodynamic monitoring. Cares included: detailed neurologic and systems exam, medication review, laboratory data review and interpretation, electrolyte management, review of available imaging, DVT/PE prophylaxis review, diet review, activity review, and coordination of care with consulted teams     Daniel JINNY Lands, APRN-NP Date:  03/03/2024   [phone]

## 2024-03-03 NOTE — Acute Stroke Response
 RN Stroke Activation Summary    Date of Service:  03/03/2024  Jahki Witham is a 63 y.o.  male.     DOB: [birth year]                  MRN#:  [id number]  Allergies:  Morphine       ASRT Arrival: 2140  Location of Response : [facility]123 (transfer not paged out met pt in room)    Page Received:  (transfer was never paged out)          Clinical Presentation: Aphasia    Total Stroke Scale Score: 1    Signs & Symptoms: Last Known Well  Last Known Well - Date: 03/03/24  Last Known Well - Time: 1429     Dysphagia screen:  Step 1: Does the Patient Have any of the Following Exclusions? : No exclusions     Step 2: Patient Evaluation : None of the above, proceed with screen     Step 3: 3 Ounce Water Challenge: Pass-No coughing or choking. Able to complete in sequential swallows       Plan:    Summary:  Patient not a candidate for acute stroke intervention at this time.  Please see Acute Stroke Response Provider Note for further information.     Radiology/Interventional Delay  Decision to IR: No  Delays: No         N/A  IV Thrombolytic in the Scanner: No    Plan: NEICU team to manage further care.    Call Completion: (P) 2150    RN handoff: [facility], RN       Signe Brunt, RN

## 2024-03-04 ENCOUNTER — Inpatient Hospital Stay: Admit: 2024-03-04 | Discharge: 2024-03-04 | Payer: BLUE CROSS/BLUE SHIELD

## 2024-03-04 LAB — HEMOGLOBIN A1C: ~~LOC~~ BKR HEMOGLOBIN A1C: 9.3 % — ABNORMAL HIGH (ref 4.0–5.7)

## 2024-03-04 LAB — 2D + DOPPLER ECHO
BSA: 1.9 m2
DOP CALC AO VTI: 48 cm
DOP CALC LVOT DIAMETER: 2.1 cm (ref 0.00–0.20)
DOP CALC MV VTI: 40 cm
EJECTION FRACTION: 62 %
FRACTIONAL SHORTENING: 36 % (ref 28–44)
INTERVENTRICULAR SEPTUM: 1 cm (ref 0.6–1)
LEFT INTERNAL DIMENSION IN SYSTOLE: 2.6 cm (ref 2.5–4)
LEFT VENTRICLE DIASTOLIC VOLUME: 144 mL — ABNORMAL HIGH (ref 62–150)
LEFT VENTRICLE DIASTOLIC VOLUME: 74 mL/m2 (ref 62–150)
LEFT VENTRICLE SYSTOLIC VOLUME: 22 mL/m2 (ref 21–61)
LEFT VENTRICLE SYSTOLIC VOLUME: 42 mL (ref 21–61)
LEFT VENTRICULAR INTERNAL DIMENSION IN DIASTOLE: 4.1 cm — ABNORMAL HIGH (ref 4.2–5.8)
MV MEAN GRADIENT: 6 mmHg
MV PEAK E VEL PW: 1.5 m/s
MV STENOSIS PRESSURE HALF TIME: 88 ms
RIGHT ATRIAL AREA: 20 cm2 (ref <18)
RIGHT HEART SYSTOLIC MMODE TAPSE: 1.8 cm (ref >1.7)
RIGHT HEART SYSTOLIC TDI S': 0.1 m/s
RIGHT VENTRICULAR BASAL DIAMETER: 4 cm (ref 2.5–4.1)
RIGHT VENTRICULAR MID DIAMETER: 2.5 cm (ref 1.9–3.5)
SINUS: 3.6 cm (ref 2.8–4)

## 2024-03-04 LAB — POC GLUCOSE
~~LOC~~ BKR POC GLUCOSE: 124 mg/dL — ABNORMAL HIGH (ref 70–100)
~~LOC~~ BKR POC GLUCOSE: 116 mg/dL — ABNORMAL HIGH (ref 70–100)
~~LOC~~ BKR POC GLUCOSE: 137 mg/dL — ABNORMAL HIGH (ref 70–100)
~~LOC~~ BKR POC GLUCOSE: 220 mg/dL — ABNORMAL HIGH (ref 70–100)

## 2024-03-04 LAB — COCAINE-URINE RANDOM: ~~LOC~~ BKR COCAINE: NEGATIVE cm (ref 3–4)

## 2024-03-04 LAB — ECG 12-LEAD
P AXIS: 60 degrees
P-R INTERVAL: 154 ms
Q-T INTERVAL: 404 ms
QRS DURATION: 102 ms
QTC CALCULATION (BAZETT): 436 ms
R AXIS: -43 degrees
T AXIS: 39 degrees
VENTRICULAR RATE: 93 {beats}/min — ABNORMAL HIGH (ref 0.2–<100)
VENTRICULAR RATE: 70 {beats}/min

## 2024-03-04 LAB — BENZODIAZEPINES-URINE RANDOM: ~~LOC~~ BKR BENZODIAZEPINES: NEGATIVE m/s

## 2024-03-04 LAB — TYPE & CROSSMATCH: ~~LOC~~ BKR ANTIBODY SCREEN: NEGATIVE mg/dL (ref >40–8.0)

## 2024-03-04 LAB — PHOSPHORUS: ~~LOC~~ BKR PHOSPHORUS: 3.4 mg/dL (ref 2.0–4.5)

## 2024-03-04 LAB — BARBITURATES-URINE RANDOM: ~~LOC~~ BKR BARBITURATES: NEGATIVE cm (ref 0.6–1)

## 2024-03-04 LAB — OPIATES-URINE RANDOM: ~~LOC~~ BKR OPIATES: NEGATIVE m/s

## 2024-03-04 LAB — HIGH SENSITIVITY TROPONIN I 0 HOUR: ~~LOC~~ BKR HIGH SENSITIVITY TROPONIN I 0 HOUR: 10 ng/L (ref <20)

## 2024-03-04 LAB — PROTIME INR (PT): ~~LOC~~ BKR PROTIME: 12 s — ABNORMAL LOW (ref 9.9–14.2)

## 2024-03-04 LAB — LIPID PROFILE: ~~LOC~~ BKR CHOLESTEROL: 233 mg/dL — ABNORMAL HIGH (ref 0.40–<200)

## 2024-03-04 LAB — CANNABINOIDS-URINE RANDOM: ~~LOC~~ BKR CANNABINOIDS (THC): NEGATIVE mL (ref 18–58)

## 2024-03-04 LAB — AMPHETAMINES-URINE RANDOM: ~~LOC~~ BKR AMPHETAMINES: NEGATIVE cm (ref <=20.6)

## 2024-03-04 LAB — CBC AND DIFF
~~LOC~~ BKR HEMATOCRIT: 33 % — ABNORMAL LOW (ref 40.0–50.0)
~~LOC~~ BKR WBC COUNT: 9 10*3/uL (ref 4.50–11.00)

## 2024-03-04 LAB — IONIZED CALCIUM: ~~LOC~~ BKR IONIZED CALCIUM: 1.1 mmol/L (ref 1.00–1.30)

## 2024-03-04 LAB — PTT (APTT): ~~LOC~~ BKR PTT: 27 s — ABNORMAL LOW (ref 24.0–36.5)

## 2024-03-04 LAB — PHENCYCLIDINES-URINE RANDOM: ~~LOC~~ BKR PHENCYCLIDINE (PCP): NEGATIVE mmHg

## 2024-03-04 LAB — MAGNESIUM: ~~LOC~~ BKR MAGNESIUM: 1.4 mg/dL — ABNORMAL LOW (ref 1.6–2.6)

## 2024-03-04 MED ORDER — HYDRALAZINE 20 MG/ML IJ SOLN
10 mg | INTRAVENOUS | 0 refills | Status: DC | PRN
Start: 2024-03-04 — End: 2024-03-05

## 2024-03-04 MED ORDER — NITROGLYCERIN 0.4 MG SL SUBL
.4 mg | SUBLINGUAL | 0 refills | Status: DC | PRN
Start: 2024-03-04 — End: 2024-03-05
  Administered 2024-03-04: 0.4 mg via SUBLINGUAL

## 2024-03-04 MED ORDER — INSULIN ASPART 100 UNIT/ML SC FLEXPEN
0-12 [IU] | Freq: Before meals | SUBCUTANEOUS | 0 refills | Status: DC
Start: 2024-03-04 — End: 2024-03-05

## 2024-03-04 MED ORDER — LABETALOL 5 MG/ML IV SYRG
10 mg | INTRAVENOUS | 0 refills | Status: DC | PRN
Start: 2024-03-04 — End: 2024-03-05
  Administered 2024-03-04: 17:00:00 10 mg via INTRAVENOUS

## 2024-03-04 MED ORDER — ROSUVASTATIN 20 MG PO TAB
20 mg | Freq: Every day | ORAL | 0 refills | Status: DC
Start: 2024-03-04 — End: 2024-03-05
  Administered 2024-03-04 – 2024-03-05 (×2): 20 mg via ORAL

## 2024-03-04 MED ORDER — PERFLUTREN LIPID MICROSPHERES 1.1 MG/ML IV SUSP
1-10 mL | Freq: Once | INTRAVENOUS | 0 refills | Status: CP | PRN
Start: 2024-03-04 — End: ?
  Administered 2024-03-04: 15:00:00 3 mL via INTRAVENOUS

## 2024-03-04 MED ORDER — METOPROLOL TARTRATE 25 MG PO TAB
12.5 mg | Freq: Two times a day (BID) | ORAL | 0 refills | Status: DC
Start: 2024-03-04 — End: 2024-03-05
  Administered 2024-03-04 – 2024-03-05 (×3): 12.5 mg via ORAL

## 2024-03-04 MED ORDER — SODIUM CHLOR 0.9% BACTERIOSTAT 0.9 % IJ SOLN (NO$)
20 mL | Freq: Once | INTRAMUSCULAR | 0 refills | Status: CP
Start: 2024-03-04 — End: ?
  Administered 2024-03-04: 15:00:00 20 mL via INTRAMUSCULAR

## 2024-03-04 MED ORDER — DEXTROSE 50 % IN WATER (D50W) IV SYRG
12.5-25 g | INTRAVENOUS | 0 refills | Status: DC | PRN
Start: 2024-03-04 — End: 2024-03-04

## 2024-03-04 MED ORDER — SODIUM CHLORIDE 0.9 % IJ SOLN
10 mL | Freq: Once | INTRAVENOUS | 0 refills | Status: CP
Start: 2024-03-04 — End: ?
  Administered 2024-03-04: 15:00:00 10 mL via INTRAVENOUS

## 2024-03-04 MED ORDER — APIXABAN 5 MG PO TAB
5 mg | Freq: Two times a day (BID) | ORAL | 0 refills | Status: DC
Start: 2024-03-04 — End: 2024-03-05
  Administered 2024-03-05 (×2): 5 mg via ORAL

## 2024-03-04 MED ADMIN — SODIUM CHLORIDE 0.9% IV SOLP [27838]: INTRAVENOUS | @ 03:00:00 | Stop: 2024-03-04 | NDC 00338004904

## 2024-03-04 NOTE — Progress Notes
 SPEECH-LANGUAGE PATHOLOGY  NO TREATMENT NOTE     ST consult received and appreciated. Attempted to complete clinical swallow evaluation this date; however, limited information gathered as pt declining to participate in portions of evaluation due to new chest pain (RN aware and notified primary team prior to evaluation attempt). Pt agreeable to participate in oral mechanism exam which was WNL. Pt with upper and lower dentures donned. Pt then declined PO trials, despite additional education and encouragement. Pt reported he does not want to eat/drink anything until he has spoken to Physician about new chest pain. Pt did state I've been eating and drinking just fine all day. Briefly discussed communication in which pt stated it's getting better. This service will continue to follow and complete evaluations as pt is available/agreeable to participate.    NIH Stroke Scale 1 at time of evaluation attempt.     H&P 03/03/24:  Pt is a is a 63 y.o. male with a PMH of PTSD, EtOH abuse, HTN, CAD, Afib w/ RVR, PE on eliquis, spontaneous pneumothorax, Hodgkin's Lymphoma and T2DM who presented to [facility] when developed expressive aphasia while at work. LKW 1430 on 6/26 with an NIH of 2. Imaging demonstrated a left M2 occlusion and was given TNK at 1823. He was subsequently transferred to [facility] for further evaluation and management.      MRI HEAD WO CONTRAST 03/04/24:  IMPRESSION   1. Punctate focus of acute infarct deep to the anterior left insula.   Additional questionable tiny area of diffusion restriction at the left   frontal cortex. Otherwise no significant diffusion abnormality to suggest   additional acute infarct elsewhere   2. Old bilateral cerebellar infarcts.   3. Moderate chronic cerebral microvascular ischemic changes.     CXR ordered; however, not yet completed at time of evaluation.     Therapist: Emerick Mott, MS,CCC-SLP  Date: 03/04/2024

## 2024-03-04 NOTE — Progress Notes
 OCCUPATIONAL THERAPY  ASSESSMENT/DISCHARGE NOTE      Name: [name]   MRN: [id number]     DOB: Nov 18, 1960      Age: 63 y.o.  Admission Date: 03/03/2024     LOS: 1 day     Date of Service: 03/04/2024    Mobility  Patient Turn/Position: Supine  Mobility Level [facility] Highest Level of Mobility (JH-HLM): Walk 250 feet or more  Distance Walked (feet): 500 ft  Level of Assistance: Stand by assistance  Assistive Device: None  Activity Limited By: Patient request to stop    Subjective  Significant Hospital Events: 63 y.o. male with a PMH of PTSD, EtOH abuse, HTN, CAD, Afib w/ RVR, PE on eliquis, spontaneous pneumothorax, Hodgkin's Lymphoma and T2DM who presented to [facility] when developed expressive aphasia while at work. LKW 1430 on 6/26 with an NIH of 2. Imaging demonstrated a left M2 occlusion and was given TNK at 1823  Mental / Cognitive: Alert;Oriented;Cooperative  Pain: No complaint of pain    Home Living Situation  Lives With: Family  Type of Home: House  Entry Stairs: No stairs  In-Home Stairs: Able to live on main level  Patient Owned Equipment: None    Prior Level of Function  Level Of Independence: Independent with ADL and community mobility without device  History of Falls in Past 3 Months: No    Vision  Comment: Glasses present on bedside table however pt denies need to wear them.    ADL's  Where Assessed: Edge of Bed  Eating Assist: Independent  Eating Deficits: No Assist Needed  LE Dressing Assist: Independent  LE Dressing Deficits: Don/Doff R Sock;Don/Doff L Sock  Comment: Pt denies need to complete an other ADLs during session.    ADL Mobility  Bed Mobility: Supine to Sit: Independent  Transfer Type: Sit to stand  Transfer: Assistance Level: From;Bed;Standby assist  Transfer: Assistive Device: None  End of Activity Status: In bed;Instructed patient to request assist with mobility;Nursing notified (bed alarm set)  Gait Distance: 500 feet  Gait: Assistance Level: Standby assist  Gait: Assistive Device: None    Cognition  Overall Cognitive Status: WFL to Adequately Complete Self Care Tasks Safely    ROM  R UE ROM: WFL   R UE ROM Method: Active  L UE ROM: WFL   L UE ROM Method: Active  Coordination: Serial Opposition WNL for Rate, Rhythm, Placement  Grasp: Bilateral Grasp Functional for Activity  ROM Comments: Reports resolution of R hand symptoms.    Upper extremity outcome measure testing not completed due to patients upper extremity function already within functional limits.     Strength / Tone  R UE Strength: WFL  L UE Strength: WFL    Education  Persons Educated: Patient  Barriers To Learning: None Noted  Teaching Methods: Verbal Instruction  Patient Response: Verbalized Understanding  Topics: Role of OT, Goals for Therapy    Assessment  No Skilled OT: At Baseline Function;Safe To Return Home;No Acute OT Goals Identified    AM-PAC 6 Clicks Daily Activity Inpatient  Putting on and taking off regular lower body clothes: None  Bathing (Including washing, rinsing, drying): None  Toileting, which includes using toilet, bedpan, or urinal: None  Putting on and taking off regular upper body clothing: None  Taking care of personal grooming such as brushing teeth: None  Eating meals: None  Daily Activity Raw Score: 24  Standardized (T-scale) Score: 57.54    Plan  Progress: Discontinue OT  OT Frequency: No Further Treatment    OT Discharge Recommendations  Recommendation: Home/prior living situation;No further OT recommended at this time  Patient Currently Requires Equipment: None    Therapist: Nidia Lee, OTR/L [phone]  Date: 03/04/2024

## 2024-03-04 NOTE — Progress Notes
 RT Adult Assessment Note    NAME:[name]             MRN: [id number]             DOB:02/02/1961          AGE: 63 y.o.  ADMISSION DATE: 03/03/2024             DAYS ADMITTED: LOS: 1 day    Additional Comments:  Impressions of the patient: Room air, no significant pulmonary history.    Intervention(s)/outcome(s): Does not meet criteria for Adult Respiratory Therapy Protocol.    Vital Signs:  Pulse: 85  RR: 12 PER MINUTE  SpO2: 94 %  O2 Device: None (Room air)  Breath Sounds:   Breath Sounds WDL: Within Defined Limits  Respiratory Effort:   Respiratory Effort/Pattern: Unlabored  Comments:

## 2024-03-04 NOTE — Progress Notes
 Vascular Neurology Progress Note    Admission Date: 03/03/2024                                                LOS: 1 day    Principal Problem:    Acute ischemic left middle cerebral artery (MCA) stroke (CMS-HCC)  Active Problems:    Primary hypertension    PTSD (post-traumatic stress disorder)    Tissue plasminogen activator (tPA) administered at other facility within 24 hours before current admission    Expressive aphasia       Assessment:  [name] is a 63 y.o. male a hx of Afib, CAD, DM2, HTN, and PE on AC (stopped Monday for EGD) presenting with aphasia. NIH 2 at OSH, CTA shows mid left M2 occlusion. Vessel too distal for intervention after discussion with IR as well as low NIH/improving sx. On arrival NIH 1 for mild aphasia, primarily noted with more complex sentences. Naming and repetition intact as well as comprehension. No other weakness or sensory changes on exam. MRI with tiny punctate acute stroke in the anterior left insula.     Suspected Stroke Etiology:  Etiology is most likely cardio embolic given history of AF and anticoagulation being held over last week for planned EGD    Current Exam:   CN intact, no obvious aphasia or speech deficits, strength and sensation intact, no ataxia    Imaging:  MRI Head:   1. Punctate focus of acute infarct deep to the anterior left insula.   Additional questionable tiny area of diffusion restriction at the left   frontal cortex. Otherwise no significant diffusion abnormality to suggest   additional acute infarct elsewhere   2. Old bilateral cerebellar infarcts.   3. Moderate chronic cerebral microvascular ischemic changes.     TTE:  Normal left ventricular size with hyperdynamic systolic function.  LVEF of 70% by Simpson's biplane method.  Flattening of the interventricular septum suggestive of right ventricular pressure/volume overload.  Normal right ventricular size and systolic function.  Mild biatrial dilatation.  Early right to left interatrial shunting by agitated saline contrast bubbles, suggestive of patent foramen ovale.  Sclerotic/calcified aortic valve and aortic annulus.  Mild aortic stenosis.  Mild to moderate aortic regurgitation.  Nonspecific mitral valve thickening with mild mitral stenosis.  Mean inflow gradient of 5-6 mmHg at 82 bpm.  Moderate to probably severe mitral regurgitation.  Severe tricuspid regurgitation.  Estimated CVP of 5-10 mmHg (although IVC may be incompletely visualized and CVP possibly underestimated).  Estimated PASP of 52 mmHg.  No pericardial effusion.      Recommendations:  - 30-day MCOT on discharge  - Start high-intensity statin (ordered Rosuvastatin 20mg )  - PT/OT eval pending  - Resume PTA Eliquis 24h-post TNK  - For any future EGD or procedures, recommend bridging with Lovenox prior to procedure  - Blood Pressure Goal: <180/105 for 24h-post TNK, after 24h would target normotension<130/80  - Lipids: LDL is 164, with goal of <70  - Blood Glucose: Hgb A1c is 9.3, with goal of <7   - Recommend adherence to PTA Metformin, consider diabetes educator consult  - Follow up appointment with [facility] should be requested on discharge    Rehabilitation  - PT/OT: Consulted, pending recs  - SLP: not indicated at this time  - Rehab Medicine: not indicated at this time  Patient was seen and examined by with Dr. Slavin     Derold Dorsch, DO  Neurology, PGY-4  Available on Voalte    ______________________________________________________________________    Subjective:  Seen at bedside this morning, wife present. She feels like at times there is still some word finding difficulties, although no obvious aphasia noticed on our visit this morning. He feels like he is pretty close to his baseline. Discussed that for any future EGD or procedures, would recommend bridging with Lovenox prior to procedure, and patient and his wife expressed understanding. He reports that he has diabetes, but is not very compliant with his Metformin. Scheduled Meds:docusate (COLACE) capsule 100 mg, 100 mg, Oral, BID  insulin aspart (U-100) (NOVOLOG FLEXPEN U-100 INSULIN) injection PEN 0-6 Units, 0-6 Units, Subcutaneous, 5 X Daily  milk of magnesium  oral suspension 30 mL, 30 mL, Oral, QDAY  NICARDIPINE 25 MG/10 ML IV SOLN (Cabinet Override), , , NOW  sennosides-docusate sodium  (SENOKOT-S) tablet 1 tablet, 1 tablet, Oral, BID    Continuous Infusions:   sodium chloride  0.9% infusion 75 mL/hr at 03/03/24 2148     PRN and Respiratory Meds:acetaminophen Q4H PRN, calcium gluconate  IV PRN (On Call from Rx) **AND** Ionized Calcium PRN **AND** Notify Physician Ongoing, dextrose 50% (D50) IV PRN, magnesium  sulfate PRN **AND** Magnesium  PRN **AND** Notify Physician Ongoing, ondansetron  (ZOFRAN ) IV Q6H PRN, potassium chloride  PRN **OR** potassium chloride  PRN **OR** potassium chloride  in water PRN      Vital Signs:  Last Filed in 24 hours Vital Signs:  24 hour Range    BP: 128/71 (06/27 0700)  Temp: 36.6 ?C (97.8 ?F) (06/27 0400)  Pulse: 68 (06/27 0700)  Respirations: 14 PER MINUTE (06/27 0700)  SpO2: 93 % (06/27 0700)  O2 Device: None (Room air) (06/27 0700)  Height: 180.3 cm (5' 11) (06/26 2126) BP: (128-174)/(69-95)   Temp:  [36.6 ?C (97.8 ?F)-36.8 ?C (98.3 ?F)]   Pulse:  [68-93]   Respirations:  [11 PER MINUTE-23 PER MINUTE]   SpO2:  [93 %-97 %]   O2 Device: None (Room air)     General physical exam:    HEENT: normocephalic, eyes open with no discharge, nares patent, oropharynx is clear with no lesions, palate intact  Chest: normal configuration, equal chest rise bilaterally  Skin: no rashes or lesions    Extended Neuro exam:    MS: alert, interactive, and oriented to self/place/time   Speech: fluent and clear, naming intact, reading intact, repetition intact   CN II-XII intact    Cerebellar exam: no ataxia, dysmetria   Strength: moves all 4 limbs spontaneously, 5/5 throughout   Sensation: intact to touch throughout   DTRs: deferred   Gait: deferred      Lab/Radiology/Other Diagnostic Tests:  24-hour labs:    Results for orders placed or performed during the hospital encounter of 03/03/24 (from the past 24 hours)   CBC AND DIFF    Collection Time: 03/03/24  9:43 PM   Result Value Ref Range    White Blood Cells 9.00 4.50 - 11.00 10*3/uL    Red Blood Cells 3.80 (L) 4.40 - 5.50 10*6/uL    Hemoglobin 11.7 (L) 13.5 - 16.5 g/dL    Hematocrit 66.4 (L) 40.0 - 50.0 %    MCV 88.2 80.0 - 100.0 fL    MCH 30.9 26.0 - 34.0 pg    MCHC 35.1 32.0 - 36.0 g/dL    RDW 84.9 88.9 - 84.9 %    Platelet Count 353 150 -  400 10*3/uL    MPV 7.4 7.0 - 11.0 fL    Neutrophils 61.5 41.0 - 77.0 %    Lymphocytes 23.3 (L) 24.0 - 44.0 %    Monocytes 11.9 4.0 - 12.0 %    Eosinophils 2.6 0.0 - 5.0 %    Basophils 0.7 0.0 - 2.0 %    Absolute Neutrophil Count 5.50 1.80 - 7.00 10*3/uL    Absolute Lymph Count 2.10 1.00 - 4.80 10*3/uL    Absolute Monocyte Count 1.10 (H) 0.00 - 0.80 10*3/uL    Absolute Eosinophil Count 0.20 0.00 - 0.45 10*3/uL    Absolute Basophil Count 0.10 0.00 - 0.20 10*3/uL    MDW (Monocyte Distribution Width) 18.0 <=20.6   PTT (APTT)    Collection Time: 03/03/24  9:43 PM   Result Value Ref Range    APTT 27.7 24.0 - 36.5 Seconds   PROTIME INR (PT)    Collection Time: 03/03/24  9:43 PM   Result Value Ref Range    Protime 12.4 9.9 - 14.2 Seconds    INR 1.1 0.9 - 1.2   COMPREHENSIVE METABOLIC PANEL    Collection Time: 03/03/24  9:43 PM   Result Value Ref Range    Sodium 137 137 - 147 mmol/L    Potassium 4.1 3.5 - 5.1 mmol/L    Chloride 102 98 - 110 mmol/L    Glucose 135 (H) 70 - 100 mg/dL    Blood Urea Nitrogen 15 7 - 25 mg/dL    Creatinine 9.13 9.59 - 1.24 mg/dL    Calcium 8.8 8.5 - 89.3 mg/dL    Total Protein 6.8 6.0 - 8.0 g/dL    Total Bilirubin 0.8 0.2 - 1.3 mg/dL    Albumin 3.8 3.5 - 5.0 g/dL    Alk Phosphatase 879 (H) 25 - 110 U/L    AST 28 7 - 40 U/L    ALT 20 7 - 56 U/L    CO2 25 21 - 30 mmol/L    Anion Gap 10 3 - 12    Glomerular Filtration Rate (GFR) >60 >60 mL/min HEMOGLOBIN A1C    Collection Time: 03/03/24  9:43 PM   Result Value Ref Range    Hemoglobin A1C 9.3 (H) 4.0 - 5.7 %   IONIZED CALCIUM    Collection Time: 03/03/24  9:43 PM   Result Value Ref Range    Ionized Calcium 1.12 1.00 - 1.30 mmol/L   MAGNESIUM     Collection Time: 03/03/24  9:43 PM   Result Value Ref Range    Magnesium  1.4 (L) 1.6 - 2.6 mg/dL   PHOSPHORUS    Collection Time: 03/03/24  9:43 PM   Result Value Ref Range    Phosphorus 3.4 2.0 - 4.5 mg/dL   LIPID PROFILE    Collection Time: 03/03/24  9:43 PM   Result Value Ref Range    Cholesterol 233 (H) <200 mg/dL    Triglycerides 831 (H) <150 mg/dL    HDL 47 >59 mg/dL    LDL 835 (H) <899 mg/dL    VLDL 66.3 mg/dL    Non HDL Cholesterol 186 mg/dL   TYPE & CROSSMATCH    Collection Time: 03/03/24  9:43 PM   Result Value Ref Range    ABO/RH(D) O POS     Antibody Screen NEG     Crossmatch Expires 03/06/2024,2359     Units Ordered 0     Record Check FOUND    POC GLUCOSE    Collection Time: 03/03/24  10:59 PM   Result Value Ref Range    Glucose, POC 124 (H) 70 - 100 mg/dL   AMPHETAMINES-URINE RANDOM    Collection Time: 03/04/24  1:26 AM   Result Value Ref Range    Amphetamines Negative Negative   BARBITURATES-URINE RANDOM    Collection Time: 03/04/24  1:26 AM   Result Value Ref Range    Barbiturates,Urine Negative Negative   BENZODIAZEPINES-URINE RANDOM    Collection Time: 03/04/24  1:26 AM   Result Value Ref Range    Benzodiazepines Negative Negative   CANNABINOIDS-URINE RANDOM    Collection Time: 03/04/24  1:26 AM   Result Value Ref Range    THC Negative Negative   COCAINE-URINE RANDOM    Collection Time: 03/04/24  1:26 AM   Result Value Ref Range    Cocaine-Urine Negative Negative   OPIATES-URINE RANDOM    Collection Time: 03/04/24  1:26 AM   Result Value Ref Range    Opiates-Urine Negative Negative   PHENCYCLIDINES-URINE RANDOM    Collection Time: 03/04/24  1:26 AM   Result Value Ref Range    Phencyclidine (PCP) Negative Negative   FENTANYL  URINE    Collection Time: 03/04/24  1:26 AM   Result Value Ref Range    Fentanyl  Negative Negative   BASIC METABOLIC PANEL    Collection Time: 03/04/24  2:55 AM   Result Value Ref Range    Sodium 136 (L) 137 - 147 mmol/L    Potassium 3.6 3.5 - 5.1 mmol/L    Chloride 104 98 - 110 mmol/L    Glucose 118 (H) 70 - 100 mg/dL    Blood Urea Nitrogen 13 7 - 25 mg/dL    Creatinine 9.26 9.59 - 1.24 mg/dL    Calcium 8.2 (L) 8.5 - 10.6 mg/dL    CO2 23 21 - 30 mmol/L    Anion Gap 9 3 - 12    Glomerular Filtration Rate (GFR) >60 >60 mL/min   CBC AND DIFF    Collection Time: 03/04/24  2:55 AM   Result Value Ref Range    White Blood Cells 7.80 4.50 - 11.00 10*3/uL    Red Blood Cells 3.49 (L) 4.40 - 5.50 10*6/uL    Hemoglobin 10.4 (L) 13.5 - 16.5 g/dL    Hematocrit 69.3 (L) 40.0 - 50.0 %    MCV 87.8 80.0 - 100.0 fL    MCH 29.8 26.0 - 34.0 pg    MCHC 33.9 32.0 - 36.0 g/dL    RDW 85.0 88.9 - 84.9 %    Platelet Count 334 150 - 400 10*3/uL    MPV 7.4 7.0 - 11.0 fL    Neutrophils 59.0 41.0 - 77.0 %    Lymphocytes 24.8 24.0 - 44.0 %    Monocytes 12.4 (H) 4.0 - 12.0 %    Eosinophils 2.5 0.0 - 5.0 %    Basophils 1.3 0.0 - 2.0 %    Absolute Neutrophil Count 4.60 1.80 - 7.00 10*3/uL    Absolute Lymph Count 1.90 1.00 - 4.80 10*3/uL    Absolute Monocyte Count 1.00 (H) 0.00 - 0.80 10*3/uL    Absolute Eosinophil Count 0.20 0.00 - 0.45 10*3/uL    Absolute Basophil Count 0.10 0.00 - 0.20 10*3/uL    MDW (Monocyte Distribution Width) 16.9 <=20.6   IONIZED CALCIUM    Collection Time: 03/04/24  2:55 AM   Result Value Ref Range    Ionized Calcium 1.15 1.00 - 1.30 mmol/L   MAGNESIUM   Collection Time: 03/04/24  2:55 AM   Result Value Ref Range    Magnesium  1.4 (L) 1.6 - 2.6 mg/dL   PHOSPHORUS    Collection Time: 03/04/24  2:55 AM   Result Value Ref Range    Phosphorus 2.9 2.0 - 4.5 mg/dL   POC GLUCOSE    Collection Time: 03/04/24  3:00 AM   Result Value Ref Range    Glucose, POC 116 (H) 70 - 100 mg/dL   POC GLUCOSE    Collection Time: 03/04/24  6:21 AM   Result Value Ref Range    Glucose, POC 137 (H) 70 - 100 mg/dL   POC GLUCOSE    Collection Time: 03/04/24 11:06 AM   Result Value Ref Range    Glucose, POC 220 (H) 70 - 100 mg/dL         Pertinent radiological imaging has been reviewed by the Vascular Neurology team.

## 2024-03-04 NOTE — Progress Notes
 Neuro Critical Care Progress Note          [name]  Admission Date: 03/03/2024  LOS: 1 day  Full Code                      ASSESSMENT/PLAN     Patient Active Problem List    Diagnosis Date Noted    Acute ischemic left middle cerebral artery (MCA) stroke (CMS-HCC) 03/03/2024    Tissue plasminogen activator (tPA) administered at other facility within 24 hours before current admission 03/03/2024    Expressive aphasia 03/03/2024    Atrial fibrillation with rapid ventricular response (CMS-HCC) 01/23/2021    PTSD (post-traumatic stress disorder) 01/23/2021    Tricuspid regurgitation 01/23/2021    Chest pain 01/21/2021    Primary hypertension 01/21/2021    Chest pain 01/21/2021     [name] is a 63 y.o. male with a PMH of PTSD, EtOH abuse, HTN, CAD, Afib w/ RVR, PE on eliquis, spontaneous pneumothorax, Hodgkin's Lymphoma and T2DM who presented to [name] when developed expressive aphasia while at work. LKW 1430 on 6/26 with an NIH of 2. Imaging demonstrated a left M2 occlusion and was given TNK at 1823. He was subsequently transferred to [name] for further evaluation and management.       [facility] and ICU course:   6/26: Admitted to NEICU. Stroke work-up intitated  6/27: NAE, aphasia significantly improved, uncontrolled DM2     Neuro:   Acute Left M2 Occlusion  S/P TNK  Aphasia     Stroke Symptom Onset time: 1430 on 03/03/24  TPA given at: 1823 on 03/03/24  Initial NIH: 2  NIH post TPA: 1  IR: No  Follow up Imaging:  MRI wo     Ischemic Stroke Risk Factors    Risk factor Present?  Target Patient at target? Comments   1. Hypertension Yes BP <180/105  Yes     2. Diabetes  Yes  HBA1C<7  Pending     3. Dyslipidemia Yes LDL<70? Pending     4. H/o stroke/TIA No         5. Atrial fibrillation Yes            If yes, anticoag? Yes, Eliquis            If no anticoagulation, why not?           6. Tobacco abuse No         Quit date  N/A            - If afib/flutter present, is patient on anticoagulation? Yes/no    - if not, why not? PLAN  - Stroke risk factor modification:    -- SBP <180    -- LDL goal < 70, LDL 164, started rosuvastatin 20mg  qDay     -- A1c goal < 7.0, A1c 9.3, Diabetic educator consulted    -- [facility] plan: resume PTA eliquis tonight     -- TTE: LVEF 70%, Early R-->L interatrial shunting suggestive of PFO, BLE doppler ordered       --BLE doppler ordered    -- Smoking cessation if indicated    -- Diabetic diet    -- Rehab Medicine, PT, OT, SLP consulted   - Neuro-ICU monitoring, neurochecks q 1 hrs  - 30 Day MCOT at discharge     Imaging:  MRI Head WO:   1. Punctate focus of acute infarct deep to the anterior left insula.   Additional questionable tiny  area of diffusion restriction at the left   frontal cortex. Otherwise no significant diffusion abnormality to suggest   additional acute infarct elsewhere   2. Old bilateral cerebellar infarcts.   3. Moderate chronic cerebral microvascular ischemic changes.     CT Head @ OSH  - Negative for acute intracranial abnormality     CTA Head/Neck @ OSH  - Occlusion of left MCA.        Sedation/Pain Management:   H/O PTSD  H/O EtOH Abuse  - PRN: APAP  - Assess for delirium daily  - UDS pending   - AWAS monitoring q4       Cardiac:   HTN  CAD  H/O Afib w/ RVR  H/O Pulmonary Embolism (07/2023)  On Eliquis  - BP <180/105  - MAP goal > 65  - PRN: labetalol & hydralazine  - hold PTA: coreg 12.5mg  BID  - Started on metoprolol  while inpt for permissive HTN  - EKG: NSR               > QT/QTc: 372/462  - TTE w/ bubble study as above  - eliquis held for GI scope procedure    --In the future will need to be on bridged therapy with therapeutic lovenox if procedure planned     Respiratory:    H/O Spontaneous Pneumothorax  - stabel on RA  - PaO2 goal >100, Spo2 goal >95%     GI:  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)  - Feeding: NPO pending RN bedside swallow eval  - neuro bowel regimen, ensure daily BM        Lab Results   Component Value Date     AST 15 01/23/2021     ALT 17 01/23/2021 ALKPHOS 130 (H) 01/23/2021         Heme/Onc:  H/O Hodgkin's Lymphoma   - Daily CBC  - VTE prophylaxis: Mechanical prophylaxis; Sequential compression device  - assess for coagulopathy, maintain platelets above 100k, INR <1.5        Lab Results   Component Value Date     HGB 12.8 (L) 11/13/2023     HCT 37.5 (L) 11/13/2023     PLTCT 438 (H) 11/13/2023     MCV 90.1 11/13/2023     MCHC 34.2 11/13/2023     PT 10.8 01/21/2021     INR 1.0 01/21/2021     PTT 65.1 (H) 01/22/2021      ID:   - Tmax 36.8   - aim for normothermia, Temp <38.3 celsius, normothermia protocol if febrile        Lab Results   Component Value Date     WBC 9.70 11/13/2023      Renal:   - Monitor hourly I/O balance   - Aim for normovolemia        Lab Results   Component Value Date     CR 0.69 01/23/2021     BUN 9 01/23/2021     CO2 27 01/23/2021     GAP 9 01/23/2021      Endocrine:    Uncontrolled T2DM  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)  - HgbA1c 9.3  - Pt not taking PTA metformin  - Blood glucose goal 100-180mg /dl  - glucose checks 5xDaily; LDCF      FEN:   - IVF: NS @ 75 ml/h  - Daily BMP, Mag, Phos & iCal  - Critical care electrolyte replacement protocol  - Magnesium  goal >2.0, i-Cal goal > 1.0,  Potassium goal >4.0 mEq/L        Lab Results   Component Value Date     NA 138 01/23/2021     K 3.8 01/23/2021     MG 1.5 (L) 01/23/2021         Prophylaxis Review:   A) GI: None  B) Lines:  None  C) Urinary Catheter:  None  D) Antibiotic Usage:  None  E) VTE:  Mechanical prophylaxis; Sequential compression device  F) Isolation: None  G)Seizures: None  H) Restraints: Patient assessed for need for restraints.   I) Disposition/Family:   Needs ICU      Primary service: NCC     Consults:  PT, OT, SLP, DM2 educator    Patient was seen and discussed with Dr. Hermenia.    Daniel Hora, MD  PGY2 Neurology    ___________________________________________________________________  [name]. Pt aphasia improved today from yesterday. Discussed plan as above. Pt agreeable. Will DC tomorrow.    OBJECTIVE                     Vital Signs: Last Filed                  Vital Signs: 24 Hour Range   BP: 174/90 (06/27 0500)  Temp: 36.6 ?C (97.8 ?F) (06/27 0400)  Pulse: 90 (06/27 0500)  Respirations: 11 PER MINUTE (06/27 0500)  SpO2: 95 % (06/27 0500)  O2 Device: None (Room air) (06/27 0500)  Height: 180.3 cm (5' 11) (06/26 2126)  Weight: 75.5 kg (166 lb 7.2 oz) (06/27 0500)  Admission / Dosing Weight: 75.5 kg (166 lb 7.2 oz) (06/26 2126)  BP: (141-174)/(69-95)   Temp:  [36.6 ?C (97.8 ?F)-36.8 ?C (98.3 ?F)]   Pulse:  [81-93]   Respirations:  [11 PER MINUTE-23 PER MINUTE]   SpO2:  [93 %-97 %]   O2 Device: None (Room air)    Intensity Pain Scale (Self Report): (not recorded) Vitals:    03/03/24 2126 03/04/24 0500   Weight: 75.5 kg (166 lb 7.2 oz) 75.5 kg (166 lb 7.2 oz)         Artificial airway:  None                  Lines:  PIVx2  Drains:     Critical Care Vitals:      ICP Monitoring:     Hemodynamics/Oxycalcs:       Intake/Output Summary:  (Last 24 hours)    Intake/Output Summary (Last 24 hours) at 03/04/2024 0635  Last data filed at 03/04/2024 0600  Gross per 24 hour   Intake 1088 ml   Output 400 ml   Net 688 ml                Physical Exam:    Blood pressure (!) 174/90, pulse 90, temperature 36.6 ?C (97.8 ?F), height 180.3 cm (5' 11), weight 75.5 kg (166 lb 7.2 oz), SpO2 95%.    Glasgow coma score:              E: 4 - Opens eyes on own         M: 6 - Follows simple motor commands             V: 5 - Alert and oriented    Neuro:   Mental Status: Aox4, some word finding difficulty   Cranial Nerves: Cranial nerves 2-12 INTACT.      - Pupil exam:  PERRL    - EOM: intact    - symmetric smile, tongue midline, palate equal rise, facial sensation intact, shrug symmetric   Motor:         RUE: Strength: 5/5;                      RLE: Strength: 5/5;             LUE: Strength: 5/5;          LLE: Strength: 5/5;    Sensory: normal to LT   Coordination: normal FTN       Lungs: clear to auscultation bilaterally                     Heart: Regular rate  Abdomen: soft, non-tender. Bowel sounds normal. No masses,  no organomegaly  Extremities: extremities normal, atraumatic, no cyanosis or edema  Skin: Skin color, texture, turgor normal. No rashes or lesions    Point of Care Testing:  (Last 24 hours)  Glucose: (!) 118 (03/04/24 0255)  POC Glucose (Download): (!) 137 (03/04/24 9378)    Lab Review:  24-hour labs:    Results for orders placed or performed during the Zuniga encounter of 03/03/24 (from the past 24 hours)   CBC AND DIFF    Collection Time: 03/03/24  9:43 PM   Result Value Ref Range    White Blood Cells 9.00 4.50 - 11.00 10*3/uL    Red Blood Cells 3.80 (L) 4.40 - 5.50 10*6/uL    Hemoglobin 11.7 (L) 13.5 - 16.5 g/dL    Hematocrit 66.4 (L) 40.0 - 50.0 %    MCV 88.2 80.0 - 100.0 fL    MCH 30.9 26.0 - 34.0 pg    MCHC 35.1 32.0 - 36.0 g/dL    RDW 84.9 88.9 - 84.9 %    Platelet Count 353 150 - 400 10*3/uL    MPV 7.4 7.0 - 11.0 fL    Neutrophils 61.5 41.0 - 77.0 %    Lymphocytes 23.3 (L) 24.0 - 44.0 %    Monocytes 11.9 4.0 - 12.0 %    Eosinophils 2.6 0.0 - 5.0 %    Basophils 0.7 0.0 - 2.0 %    Absolute Neutrophil Count 5.50 1.80 - 7.00 10*3/uL    Absolute Lymph Count 2.10 1.00 - 4.80 10*3/uL    Absolute Monocyte Count 1.10 (H) 0.00 - 0.80 10*3/uL    Absolute Eosinophil Count 0.20 0.00 - 0.45 10*3/uL    Absolute Basophil Count 0.10 0.00 - 0.20 10*3/uL    MDW (Monocyte Distribution Width) 18.0 <=20.6   PTT (APTT)    Collection Time: 03/03/24  9:43 PM   Result Value Ref Range    APTT 27.7 24.0 - 36.5 Seconds   PROTIME INR (PT)    Collection Time: 03/03/24  9:43 PM   Result Value Ref Range    Protime 12.4 9.9 - 14.2 Seconds    INR 1.1 0.9 - 1.2   COMPREHENSIVE METABOLIC PANEL    Collection Time: 03/03/24  9:43 PM   Result Value Ref Range    Sodium 137 137 - 147 mmol/L    Potassium 4.1 3.5 - 5.1 mmol/L    Chloride 102 98 - 110 mmol/L    Glucose 135 (H) 70 - 100 mg/dL    Blood Urea Nitrogen 15 7 - 25 mg/dL Creatinine 9.13 9.59 - 1.24 mg/dL    Calcium 8.8 8.5 - 89.3 mg/dL    Total  Protein 6.8 6.0 - 8.0 g/dL    Total Bilirubin 0.8 0.2 - 1.3 mg/dL    Albumin 3.8 3.5 - 5.0 g/dL    Alk Phosphatase 879 (H) 25 - 110 U/L    AST 28 7 - 40 U/L    ALT 20 7 - 56 U/L    CO2 25 21 - 30 mmol/L    Anion Gap 10 3 - 12    Glomerular Filtration Rate (GFR) >60 >60 mL/min   IONIZED CALCIUM    Collection Time: 03/03/24  9:43 PM   Result Value Ref Range    Ionized Calcium 1.12 1.00 - 1.30 mmol/L   MAGNESIUM     Collection Time: 03/03/24  9:43 PM   Result Value Ref Range    Magnesium  1.4 (L) 1.6 - 2.6 mg/dL   PHOSPHORUS    Collection Time: 03/03/24  9:43 PM   Result Value Ref Range    Phosphorus 3.4 2.0 - 4.5 mg/dL   LIPID PROFILE    Collection Time: 03/03/24  9:43 PM   Result Value Ref Range    Cholesterol 233 (H) <200 mg/dL    Triglycerides 831 (H) <150 mg/dL    HDL 47 >59 mg/dL    LDL 835 (H) <899 mg/dL    VLDL 66.3 mg/dL    Non HDL Cholesterol 186 mg/dL   TYPE & CROSSMATCH    Collection Time: 03/03/24  9:43 PM   Result Value Ref Range    ABO/RH(D) O POS     Antibody Screen NEG     Crossmatch Expires 03/06/2024,2359     Units Ordered 0     Record Check FOUND    POC GLUCOSE    Collection Time: 03/03/24 10:59 PM   Result Value Ref Range    Glucose, POC 124 (H) 70 - 100 mg/dL   AMPHETAMINES-URINE RANDOM    Collection Time: 03/04/24  1:26 AM   Result Value Ref Range    Amphetamines Negative Negative   BARBITURATES-URINE RANDOM    Collection Time: 03/04/24  1:26 AM   Result Value Ref Range    Barbiturates,Urine Negative Negative   BENZODIAZEPINES-URINE RANDOM    Collection Time: 03/04/24  1:26 AM   Result Value Ref Range    Benzodiazepines Negative Negative   CANNABINOIDS-URINE RANDOM    Collection Time: 03/04/24  1:26 AM   Result Value Ref Range    THC Negative Negative   COCAINE-URINE RANDOM    Collection Time: 03/04/24  1:26 AM   Result Value Ref Range    Cocaine-Urine Negative Negative   OPIATES-URINE RANDOM    Collection Time: 03/04/24 1:26 AM   Result Value Ref Range    Opiates-Urine Negative Negative   PHENCYCLIDINES-URINE RANDOM    Collection Time: 03/04/24  1:26 AM   Result Value Ref Range    Phencyclidine (PCP) Negative Negative   FENTANYL  URINE    Collection Time: 03/04/24  1:26 AM   Result Value Ref Range    Fentanyl  Negative Negative   BASIC METABOLIC PANEL    Collection Time: 03/04/24  2:55 AM   Result Value Ref Range    Sodium 136 (L) 137 - 147 mmol/L    Potassium 3.6 3.5 - 5.1 mmol/L    Chloride 104 98 - 110 mmol/L    Glucose 118 (H) 70 - 100 mg/dL    Blood Urea Nitrogen 13 7 - 25 mg/dL    Creatinine 9.26 9.59 - 1.24 mg/dL    Calcium 8.2 (L) 8.5 -  10.6 mg/dL    CO2 23 21 - 30 mmol/L    Anion Gap 9 3 - 12    Glomerular Filtration Rate (GFR) >60 >60 mL/min   CBC AND DIFF    Collection Time: 03/04/24  2:55 AM   Result Value Ref Range    White Blood Cells 7.80 4.50 - 11.00 10*3/uL    Red Blood Cells 3.49 (L) 4.40 - 5.50 10*6/uL    Hemoglobin 10.4 (L) 13.5 - 16.5 g/dL    Hematocrit 69.3 (L) 40.0 - 50.0 %    MCV 87.8 80.0 - 100.0 fL    MCH 29.8 26.0 - 34.0 pg    MCHC 33.9 32.0 - 36.0 g/dL    RDW 85.0 88.9 - 84.9 %    Platelet Count 334 150 - 400 10*3/uL    MPV 7.4 7.0 - 11.0 fL    Neutrophils 59.0 41.0 - 77.0 %    Lymphocytes 24.8 24.0 - 44.0 %    Monocytes 12.4 (H) 4.0 - 12.0 %    Eosinophils 2.5 0.0 - 5.0 %    Basophils 1.3 0.0 - 2.0 %    Absolute Neutrophil Count 4.60 1.80 - 7.00 10*3/uL    Absolute Lymph Count 1.90 1.00 - 4.80 10*3/uL    Absolute Monocyte Count 1.00 (H) 0.00 - 0.80 10*3/uL    Absolute Eosinophil Count 0.20 0.00 - 0.45 10*3/uL    Absolute Basophil Count 0.10 0.00 - 0.20 10*3/uL    MDW (Monocyte Distribution Width) 16.9 <=20.6   IONIZED CALCIUM    Collection Time: 03/04/24  2:55 AM   Result Value Ref Range    Ionized Calcium 1.15 1.00 - 1.30 mmol/L   MAGNESIUM     Collection Time: 03/04/24  2:55 AM   Result Value Ref Range    Magnesium  1.4 (L) 1.6 - 2.6 mg/dL   PHOSPHORUS    Collection Time: 03/04/24  2:55 AM   Result Value Ref Range    Phosphorus 2.9 2.0 - 4.5 mg/dL   POC GLUCOSE    Collection Time: 03/04/24  3:00 AM   Result Value Ref Range    Glucose, POC 116 (H) 70 - 100 mg/dL   POC GLUCOSE    Collection Time: 03/04/24  6:21 AM   Result Value Ref Range    Glucose, POC 137 (H) 70 - 100 mg/dL       Radiology and Other Diagnostic Procedures Review:  Pertinent radiologic and diagnostic procedures reviewed.

## 2024-03-04 NOTE — Progress Notes
 PHYSICAL THERAPY  DISCHARGE NOTE      Name: [name]   MRN: [id number]     DOB: 08/14/1961      Age: 63 y.o.  Admission Date: 03/03/2024     LOS: 1 day     Date of Service: 03/04/2024      Per discussion with OT, patient reports no concerns with mobility with no PT goals identified. PT will discontinue service, please re-consult if the patient has a decline in functional status.     Therapist: Teddie Curd, PT, DPT [phone]   Date: 03/04/2024

## 2024-03-04 NOTE — Discharge Planning (AHS/AVS)
 If interested in outpatient speech therapy services:    [facility] [place]  [address]  [place], [place] [address]    P: [phone]    Hours of Operation    Sunday Closed    Monday8:00 AM - 5:00 PM  Tuesday7:30 AM - 5:00 PM  Wednesday7:30 AM - 4:30 PM  Thursday7:30 AM - 5:00 PM  Friday7:30 AM - 3:30 PM    Saturday Closed    If they require an order to start services, you can reach out to your primary care provider for a script if not already provided on inpatient hospital discharge. Thank you!

## 2024-03-04 NOTE — Consults
 Physical Medicine & Rehabilitation Consult Service    Name: [name]   MRN: [id number]     DOB: 1961-01-05      Age: 63 y.o.  Admission Date: 03/03/2024     LOS: 1 day     Date of Service: 03/04/2024      Date of Service: 03/04/2024  Financial Class: Payor: [place] / Plan: BCBS [place] BLUE OUT OF STATE / Product Type: PPO /   Referring Physician: Hermenia Beverley SAILOR, MD  Reason for Consult: evaluate for Post-Acute Rehab/Placement  Precautions: Fall  Weight Bearing Precautions: WBAT    Assessment & Plan:  Principal Problem:    Acute ischemic left middle cerebral artery (MCA) stroke (CMS-HCC)  Active Problems:    Primary hypertension    PTSD (post-traumatic stress disorder)    Tissue plasminogen activator (tPA) administered at other facility within 24 hours before current admission    Expressive aphasia  Gait abnormality  Impaired mobility/ADLs  Impaired transfers  Expressive Aphasia, improving  Right upper extremity weakness, improving       [name] is a 63 y.o. year old male admitted to [facility] on  03/03/2024 with the following issues:    stroke, left MCA s/p TNK    Impairments: aphasia, dysarthria, poor activity tolerance, sensory loss, and weakness  Activity Limitations:  grooming, bathing, dressing - upper, transfers, stairs, expression, and social interaction  Participation Restrictions: unable to return home safely  Family / Patient Dispositional Goals: return home to previous level of function    Overall Functional Goals  Gait and mobility Pending therapy evals   Transfers Pending therapy evals   Upper body dressing Pending therapy evals   Lower body dressing Pending therapy evals   Toileting Pending therapy evals   Bathing Pending therapy evals   Cognition / Communication Cognition grossly intact          Recommendations:  Post-acute care rehabilitation needs: Home with outpatient speech therapy   ____________________________________________________________________________    Impaired gait/mobility/transfers:  The patient will benefit from continued work with PT to address mobility deficits    Impaired ADLs:  The patient will benefit from ongoing OT to address functional deficits    Aphasia:   The patient would benefit from an SLP eval to assist with expressive aphasia    Thank you for this consultation.  Please call our consult pager with questions or concerns.    Leontine SHAUNNA Curls, DO  [employer]    History of Present Illness:    CC: Trouble speaking    Hospital Course: [name] is a pleasant 63 y.o. male with PMH of PTSD, EtOH abuse, HTN, CAD, Afib w/ RVR, PE on eliquis, spontaneous pneumothorax, Hodgkin's Lymphoma and T2DM who presented to [facility] when he developed expressive aphasia while at work. Imaging demonstrated a left M2 occlusion and was given TNK at 1823. He was subsequently transferred to [facility] for further evaluation and management.     Patient reports that he had difficulty speaking along with numbness and weakness of his right hand. Reports that now the symptoms are getting much better. Right hand is back to normal but has some minor difficulties with speaking.      The primary team has consulted PT and OT, and will continue working with therapies to address functional and mobility deficits, rehab is now consulted for post-acute rehab/placement recommendations.  SLP has not yet been consulted as well for aphasia.     The patient's family/social support consists  of: daughter.    Past Medical History:    Acid reflux    Allergy    Anemia    Atrial fibrillation (CMS-HCC)    CAD (coronary artery disease)    CHF (congestive heart failure) (CMS-HCC)    Diabetes (CMS-HCC)    GERD (gastroesophageal reflux disease)    Heart valve problem    Hodgkin's lymphoma (CMS-HCC)    Hx pulmonary embolism    Hypertension    Neuropathy    Past myocardial infarction    PTSD (post-traumatic stress disorder)    PVD (peripheral vascular disease)    Type II diabetes mellitus (CMS-HCC)        Surgical History: Procedure Laterality Date    CARDIAC CATHERIZATION  09/2021    DES to Diag LAD    ESOPHAGOGASTRODUODENOSCOPY WITH ENDOSCOPIC MUCOSAL RESECTION - FLEXIBLE N/A 11/13/2023    Performed by Rogers Kipper, MD at [facility] ENDO    ESOPHAGOGASTRODUODENOSCOPY WITH BIOPSY - FLEXIBLE N/A 11/13/2023    Performed by Rogers Kipper, MD at [facility] ENDO    ABDOMINAL EXPLORATION SURGERY  1981    Hodgkins    ATRIAL ABLATION SURGERY      COLONOSCOPY      ESOPHAGEAL DILATATION      GASTROSTOMY TUBE PLACEMENT      HX APPENDECTOMY  1981    HX CHOLECYSTECTOMY      HX ENDOSCOPY  1995    Partial pancreas    PANCREATECTOMY      PERICARDIUM SURGERY      UPPER GASTROINTESTINAL ENDOSCOPY          Social History     Socioeconomic History    Marital status: Single   Tobacco Use    Smoking status: Never    Smokeless tobacco: Never   Vaping Use    Vaping status: Never Used   Substance and Sexual Activity    Alcohol use: Not Currently     Comment: last drink fall 2024    Drug use: Never    Sexual activity: Not Currently       Family history reviewed; non-contributory     Scheduled Meds:docusate (COLACE) capsule 100 mg, 100 mg, Oral, BID  insulin aspart (U-100) (NOVOLOG FLEXPEN U-100 INSULIN) injection PEN 0-6 Units, 0-6 Units, Subcutaneous, 5 X Daily  metoprolol  tartrate tablet 12.5 mg, 12.5 mg, Oral, BID  milk of magnesium  oral suspension 30 mL, 30 mL, Oral, QDAY  rosuvastatin (CRESTOR) tablet 20 mg, 20 mg, Oral, QDAY  sennosides-docusate sodium  (SENOKOT-S) tablet 1 tablet, 1 tablet, Oral, BID    Continuous Infusions:   sodium chloride  0.9% infusion 75 mL/hr at 03/04/24 1148     PRN and Respiratory Meds:acetaminophen Q4H PRN, calcium gluconate  IV PRN (On Call from Rx) **AND** Ionized Calcium PRN **AND** Notify Physician Ongoing, dextrose 50% (D50) IV PRN, hydrALAZINE Q6H PRN, labetalol (NORMODYNE; TRANDATE) injection Q15 MIN PRN, magnesium  sulfate PRN **AND** Magnesium  PRN **AND** Notify Physician Ongoing, ondansetron  (ZOFRAN ) IV Q6H PRN, potassium chloride  PRN **OR** potassium chloride  PRN **OR** potassium chloride  in water PRN       Allergies   Allergen Reactions    Morphine ANAPHYLAXIS and BLISTERS     Per pt, tolerates ALL other opioids                 Prior Level of Function:  The patient was independent for all mobility/ambulation and activities of daily living.      Home Environment:  No data recorded  No data recorded  No data recorded  No data recorded  No data recorded  No data recorded  No data recorded  @ADDRESSFULL @      Current Level Of Function:  PT Gait:           OT     SLP COGNITIVE EVALUATION SUMMARY     PRAGMATICS:    BEHAVIOR:    AUDITORY COMPREHENSION:      ORIENTATION:    AUDITORY ATTENTION/WORKING MEMORY:    AUDITORY MEMORY/SUSTAINED ATTENTION:    NEW LEARNING:    SEQUENCING/ORGANIZATION:    PROBLEM SOLVING:    REASONING:      MATH/MONEY SKILLS:      VISUAL PERCEPTUAL:    SWALLOW EVALUATION SUMMARY                       Review of Systems:    A 14 point review of systems was negative except for: that noted in the HPI      Physical Exam:    BP: 123/75 (06/27 1300)  Temp: 36.9 ?C (98.4 ?F) (06/27 1200)  Pulse: 68 (06/27 1300)  Respirations: 17 PER MINUTE (06/27 1300)  SpO2: 94 % (06/27 1300)  O2 Device: None (Room air) (06/27 1300)  Height: 180.3 cm (5' 10.98) (06/27 0700)  Body mass index is 23.16 kg/m?.     Gen: Alert  HEENT: EOMI  Neck: Supple, no elevated JVP  Heart: Extremities well perfused  Lungs: non labored breathing  Abdomen: appears non-distended  GU:  - Foley  Skin: no gross lesions appreciated  Ext: purposeful movement of extremities   MS:   Root Right Left   Elbow Flexion C5 5 5   Elbow Extension C7 5 5   Wrist Extension C6 5 5   Finger Flexion C8 5 5   Finger Abduction T1 5 5   Hip Flexion L2 5 5   Knee Flexion L5/S1 5 5   Knee Extension L3 5 5   Dorsiflexion L4 5 5   Plantarflexion S1 5 5   EHL Extension L5 5 5     Neuro:  Cranial Nerves Cranial Nerves 2-12 are grossly intact   Upper Extremity Tone Normal   Lower Extremity Tone Normal   Upper Extremity Sensation Intact to light touch bilaterally   Lower Extremity Sensation Intact to light touch bilaterally   Memory/Cognition/Speech Within limits, no dysarthria           Intake/Output Summary (Last 24 hours) at 03/04/2024 1316  Last data filed at 03/04/2024 1200  Gross per 24 hour   Intake 1960 ml   Output 875 ml   Net 1085 ml        Hematology:    Lab Results   Component Value Date    HGB 10.4 03/04/2024    HGB 10.7 01/23/2021    HCT 30.6 03/04/2024    HCT 31.5 01/23/2021    PLTCT 334 03/04/2024    PLTCT 460 01/23/2021    WBC 7.80 03/04/2024    WBC 7.2 01/23/2021    NEUT 59.0 03/04/2024    NEUT 61 01/21/2021    ANC 4.60 03/04/2024    ANC 5.13 01/21/2021    ALC 1.90 03/04/2024    ALC 2.23 01/21/2021    MONA 12.4 03/04/2024    MONA 10 01/21/2021    AMC 1.00 03/04/2024    AMC 0.83 01/21/2021    EOSA 2.5 03/04/2024    EOSA 1 01/21/2021    ABC 0.10 03/04/2024    ABC 0.19  01/21/2021    MCV 87.8 03/04/2024    MCV 92.3 01/23/2021    MCH 29.8 03/04/2024    MCH 31.5 01/23/2021    MCHC 33.9 03/04/2024    MCHC 34.1 01/23/2021    MPV 7.4 03/04/2024    MPV 7.4 01/23/2021    RDW 14.9 03/04/2024    RDW 15.4 01/23/2021   , Coagulation:    Lab Results   Component Value Date    PT 12.4 03/03/2024    PT 10.8 01/21/2021    PTT 27.7 03/03/2024    PTT 65.1 01/22/2021    INR 1.1 03/03/2024    INR 1.0 01/21/2021    and General Chemistry:    Lab Results   Component Value Date    NA 136 03/04/2024    NA 138 01/23/2021    K 3.6 03/04/2024    K 3.8 01/23/2021    CL 104 03/04/2024    CL 102 01/23/2021    CO2 23 03/04/2024    CO2 27 01/23/2021    GAP 9 03/04/2024    GAP 9 01/23/2021    BUN 13 03/04/2024    BUN 9 01/23/2021    CR 0.73 03/04/2024    CR 0.69 01/23/2021    GLU 118 03/04/2024    GLU 131 01/23/2021    CA 8.2 03/04/2024    CA 8.4 01/23/2021    ALBUMIN 3.8 03/03/2024    ALBUMIN 3.2 01/23/2021    OBSCA 1.15 03/04/2024    MG 1.4 03/04/2024    MG 1.5 01/23/2021    TOTBILI 0.8 03/03/2024    TOTBILI 0.5 01/23/2021 PO4 2.9 03/04/2024        Radiology:      MRI Head, 03/04/24    1. Punctate focus of acute infarct deep to the anterior left insula.   Additional questionable tiny area of diffusion restriction at the left   frontal cortex. Otherwise no significant diffusion abnormality to suggest   additional acute infarct elsewhere   2. Old bilateral cerebellar infarcts.   3. Moderate chronic cerebral microvascular ischemic changes.      Leontine SHAUNNA Curls, DO

## 2024-03-04 NOTE — Progress Notes
 OCCUPATIONAL THERAPY  NOTE   Name: [name]   MRN: [id number]     DOB: 1961/08/19      Age: 63 y.o.  Admission Date: 03/03/2024     LOS: 1 day     Date of Service: 03/04/2024    Attempted to see 2x this morning. During first attempt, ECHO at bedside. During second attempt, primary team at bedside for extended period of time. Will follow up this afternoon to complete evaluation.     Therapist: Nidia Lee, OTR/L [phone]  Date: 03/04/2024

## 2024-03-04 NOTE — Consults
 INPATIENT DIABETES EDUCATION TEAM -Clinical Excellence Nursing Practice    Reason for Consult: uncontrolled hyperglycemia    Patient may benefit from: follow up with outpatient provider    This consult team does not write orders. Primary Team is responsible for placing orders.    PLEASE USE THE DISCHARGE ADULT DIABETES INSULIN AND SUPPLIES  ORDERSET  For questions regarding insurance coverage please contact Case Management    ICD-10 Coding for supplies requires appropriate coding to get enough supplies. Needles and lancets will ask you for a diagnosis to identify the patient.       Supplies/Resources provided:   handouts    Met with [name] to discuss diabetes management. [name] has been living with T2DM PTA, prescribed metformin. Pt familiar with BG monitoring and feels comfortable doing it at home. Encouraged pt to check 1x/day before breakfast. Reviewed metformin, timing of dose, and side effects. Discussed current A1c and goals. Encouraged pt to follow up with his outpatient provider for close monitoring of his A1c. Provided information on diet. No questions or concerns at this time.     PTA meds: metformin    Monitoring:   Pt stated checking blood glucose levels 0 times a day.   Encouraged pt to check BG 1x/day and in the setting of symptoms of hypo/hyperglycemia.   Pt stated they have a glucose meter and supplies at home.   Discussed goals of 80-130 prior to meals, or up to 180 two hours after .     Hyperglycemia:   Discussed s/s, definition, prevention and treatment.   Reviewed meaning of HbA1c, current A1c 9.8% and goal of < 7%.     Diet:   Encouraged pt to eat 3 well balanced meals/day and no more than 1 snack/day < 15g carbs.     Activity:   Encouraged daily physical activity.     Barriers to care: none    F/U: Patient sees XXX for diabetes management. Instructed to call their diabetes provider for glucose consistently above 250 or below 70 after treatment.     Provided education to patient regarding new insulin regimen:  Discussed the frequency of monitoring blood sugar and recommended goals of 80-130 mg/dL, before meals. Encouraged patient to record results in log book and to take meter and log book to provider visits.     Please feel free to contact this educator should any additional needs or concerns arise.     Thank you for the consult.     Reche Ing, BSN, RN, CDCES  Clinical Nurse Excellence - Diabetes Educator  (Phone) [phone] (Pager) [phone]   Available on [employer]     08:00-4:30 weekdays, If no response, please call team pager [phone])  Diabetes Education Team office [phone])

## 2024-03-04 NOTE — Case Management (ED)
 Case Management Admission Assessment    NAME:[name]                          MRN: [id number]             DOB:1961/07/04          AGE: 63 y.o.  ADMISSION DATE: 03/03/2024             DAYS ADMITTED: LOS: 1 day      Today?s Date: 03/04/2024    Source of Information: Patient    H&P: [name] is a 62 y.o. male with a PMH of PTSD, EtOH abuse, HTN, CAD, Afib w/ RVR, PE on eliquis, spontaneous pneumothorax, Hodgkin's Lymphoma and T2DM who presented to [facility] when developed expressive aphasia while at work. LKW 1430 on 6/26 with an NIH of 2. Imaging demonstrated a left M2 occlusion and was given TNK at 1823. He was subsequently transferred to [facility] for further evaluation and management.       Plan  Plan: Case Management Assessment, No Further Intervention Required  This CM met with pt for assessment on this date.  Provided contact information and explanation of SW/NCM roles.  Discussed [facility], Preparing for Discharge, and [facility] hand-outs.  Provided opportunity for questions and discussion. Pt/family encouraged to contact Case Management team with questions and concerns during hospitalization and until patient is able to transition back to the patient's primary care physician.    Assessment Notes:    Pt resides in a two-level home with no steps to entry. The home accomodates single level living.    Pt lives alone in [place], [place].    Pt is independent with ADLs.    Pt is employed full time    Pt could have intermittent supervision, if needed. This would be provided by [name]    Pt utilizes no DME. Denies use of home infusion or home enteral in the past.    Pt's previous history of HH, SNF, IPR and LTACH includes: NA    PCP: Current with [place], William B    Rx: Manages and orders independently, meds are affordable    DC transportation will be provided by: [name]    Most recent therapy recommendations:    PT consulted--recs pending  OT consulted--recs pending  SLP consulted--recs pending  Rehab Medicine consulted--recs pending    Case Management Needs:    Anticipate return home when medically stable. Discussed potential outpatient speech therapy with patient but he feels his language has improved greatly since admission. NCM offered to add clinic information close to home address on file to AVS should he change his mind and patient was agreeable to this.    Patient Address/Phone  [address] [place] [address]  [phone] (home)     Emergency Contact  Extended Emergency Contact Information  Primary Emergency Contact: MAURER,[name]  Mobile Phone: [phone]  Relation: Daughter    Healthcare Directive  Healthcare Directive: No, patient does not have a healthcare directive  Would patient like to fill out a (a new) Healthcare Directive?: No, patient declined    Transportation  Does the Patient Need Case Management to Arrange Discharge Transport? (ex: facility, ambulance, wheelchair/stretcher, Medicaid, cab, other): No  Will the Patient Use Family Transport?: Yes  Transportation Name, Phone and Availability #1: [name] [phone]    Expected Discharge Date  03/07/2024     Living Situation Prior to Admission  Living Arrangements  Type of  Residence: Home, independent  Living Arrangements: Alone  Financial risk analyst / Tub: Psychologist, counselling, Tub/Shower Unit  How many levels in the residence?: 2  Can patient live on one level if needed?: Yes  Does residence have entry and/or inside stairs?: No  Assistance needed prior to admit or anticipated on discharge: No  Who provides assistance or could if needed?: [name]  Are they in good health?: Yes  Can support system provide 24/7 care if needed?: Maybe  Level of Function   Prior level of function: Independent  Cognitive Abilities   Cognitive Abilities: Alert and Oriented, Engages in problem solving and Pensions consultant  Primary Insurance: Nurse, learning disability  Secondary Insurance: No insurance  Additional Coverage: None  Medication Coverage    Medication Coverage: Commercial insurance  Have you experienced a noticeable increase in your copay costs recently?: No  Are current medications affordable?: Yes  Do You Use a Co-Pay Card or a Medication Assistance Program to Help Manage Medication Costs?: No  Do You Manage Your Own Medications?: Yes  Source of Income   Source Of Income: Employed  Financial Assistance Needed?    Psychosocial Needs  Mental Health  Mental Health History: No  Substance Use History  Substance Use History Screen: No  Other    Current/Previous Services  PCP  Dovie Elsie NOVAK, [phone], [phone]  Pharmacy    [facility] [phone], [place] - [address] AT [facility] OF [address]  [address]  [place] [address]  Phone: [phone] Fax: [phone]    Durable Medical Equipment   Durable Medical Equipment at home: None  Home Health  Receiving home health: No  Hemodialysis or Peritoneal Dialysis  Undergoing hemodialysis or peritoneal dialysis: No  Tube/Enteral Feeds  Receive tube/enteral feeds: No  Infusion  Receive infusions: No  Private Duty  Private duty help used: No  Home and Community Based Services  Home and community based services: No  Ryan White  Ryan White: N/A  Hospice  Hospice: No  Outpatient Therapy  PT: No  OT: No  SLP: No  Skilled Nursing Facility/Nursing Home  SNF: No  NH: No  Inpatient Rehab  IPR: No  Long-Term Acute Care Hospital  LTACH: No  Acute Hospital Stay  Acute Hospital Stay: In the past  Was patient's stay within the last 30 days?: No    Amaiya Scruton Geneticist, molecular, RN, Surveyor, minerals, SCRN  Nurse Case Manager  Neuro ICU and ENT  Available on [place]  Ext [phone]

## 2024-03-05 ENCOUNTER — Inpatient Hospital Stay
Admit: 2024-03-04 | Discharge: 2024-03-05 | Disposition: A | Discharge status: Home / Self Care | Payer: BLUE CROSS/BLUE SHIELD | Source: Other Acute Inpatient Hospital | Admitting: Neurology

## 2024-03-05 DIAGNOSIS — E039 Hypothyroidism, unspecified: Secondary | ICD-10-CM

## 2024-03-05 DIAGNOSIS — F4310 Post-traumatic stress disorder, unspecified: Secondary | ICD-10-CM

## 2024-03-05 DIAGNOSIS — Z955 Presence of coronary angioplasty implant and graft: Secondary | ICD-10-CM

## 2024-03-05 DIAGNOSIS — R29701 NIHSS score 1: Secondary | ICD-10-CM

## 2024-03-05 DIAGNOSIS — Z885 Allergy status to narcotic agent status: Secondary | ICD-10-CM

## 2024-03-05 DIAGNOSIS — I10 Essential (primary) hypertension: Secondary | ICD-10-CM

## 2024-03-05 DIAGNOSIS — Z9282 Status post administration of tPA (rtPA) in a different facility within the last 24 hours prior to admission to current facility: Secondary | ICD-10-CM

## 2024-03-05 DIAGNOSIS — Z8571 Personal history of Hodgkin lymphoma: Secondary | ICD-10-CM

## 2024-03-05 DIAGNOSIS — E1140 Type 2 diabetes mellitus with diabetic neuropathy, unspecified: Secondary | ICD-10-CM

## 2024-03-05 DIAGNOSIS — I080 Rheumatic disorders of both mitral and aortic valves: Secondary | ICD-10-CM

## 2024-03-05 DIAGNOSIS — H5462 Unqualified visual loss, left eye, normal vision right eye: Secondary | ICD-10-CM

## 2024-03-05 DIAGNOSIS — Z87892 Personal history of anaphylaxis: Secondary | ICD-10-CM

## 2024-03-05 DIAGNOSIS — I25119 Atherosclerotic heart disease of native coronary artery with unspecified angina pectoris: Secondary | ICD-10-CM

## 2024-03-05 DIAGNOSIS — Z86711 Personal history of pulmonary embolism: Secondary | ICD-10-CM

## 2024-03-05 DIAGNOSIS — Z800 Family history of malignant neoplasm of digestive organs: Secondary | ICD-10-CM

## 2024-03-05 DIAGNOSIS — E1165 Type 2 diabetes mellitus with hyperglycemia: Secondary | ICD-10-CM

## 2024-03-05 DIAGNOSIS — R269 Unspecified abnormalities of gait and mobility: Secondary | ICD-10-CM

## 2024-03-05 DIAGNOSIS — E1151 Type 2 diabetes mellitus with diabetic peripheral angiopathy without gangrene: Secondary | ICD-10-CM

## 2024-03-05 DIAGNOSIS — I4891 Unspecified atrial fibrillation: Secondary | ICD-10-CM

## 2024-03-05 DIAGNOSIS — Z7982 Long term (current) use of aspirin: Secondary | ICD-10-CM

## 2024-03-05 DIAGNOSIS — Z7901 Long term (current) use of anticoagulants: Secondary | ICD-10-CM

## 2024-03-05 DIAGNOSIS — Z923 Personal history of irradiation: Secondary | ICD-10-CM

## 2024-03-05 DIAGNOSIS — Q2112 Patent foramen ovale: Secondary | ICD-10-CM

## 2024-03-05 DIAGNOSIS — I252 Old myocardial infarction: Secondary | ICD-10-CM

## 2024-03-05 DIAGNOSIS — R4701 Aphasia: Secondary | ICD-10-CM

## 2024-03-05 DIAGNOSIS — I083 Combined rheumatic disorders of mitral, aortic and tricuspid valves: Secondary | ICD-10-CM

## 2024-03-05 DIAGNOSIS — Z7409 Other reduced mobility: Secondary | ICD-10-CM

## 2024-03-05 DIAGNOSIS — Z9049 Acquired absence of other specified parts of digestive tract: Secondary | ICD-10-CM

## 2024-03-05 LAB — POC GLUCOSE
~~LOC~~ BKR POC GLUCOSE: 134 mg/dL — ABNORMAL HIGH (ref 70–100)
~~LOC~~ BKR POC GLUCOSE: 140 mg/dL — ABNORMAL HIGH (ref 70–100)
~~LOC~~ BKR POC GLUCOSE: 141 mg/dL — ABNORMAL HIGH (ref 70–100)
~~LOC~~ BKR POC GLUCOSE: 190 mg/dL — ABNORMAL HIGH (ref 70–100)

## 2024-03-05 LAB — SODIUM-URINE RANDOM: ~~LOC~~ BKR UR SODIUM, RAN: 74 mmol/L

## 2024-03-05 LAB — OSMOLALITY-URINE RANDOM: ~~LOC~~ BKR UR. OSMOLALITY: 335 mosm/kg (ref 50–1400)

## 2024-03-05 LAB — CREATININE-URINE RANDOM: ~~LOC~~ BKR UR CREATININE, RAN: 73 mg/dL

## 2024-03-05 LAB — HIGH SENSITIVITY TROPONIN I 4 HR
~~LOC~~ BKR HI SEN TNI DELTA 4-2: -2
~~LOC~~ BKR HIGH SENSITIVITY TROPONIN I 4 HOUR: 9 ng/L (ref <20)

## 2024-03-05 LAB — SPECIFIC GRAVITY-URINE RANDOM: ~~LOC~~ BKR SP. GRAVITY: 1 (ref 1.005–1.030)

## 2024-03-05 LAB — OSMOLALITY: ~~LOC~~ BKR OSMOLALITY, SERUM: 278 mosm/kg — ABNORMAL LOW (ref 280–307)

## 2024-03-05 LAB — UREA NITROGEN-URINE RANDOM: ~~LOC~~ BKR UR UREA NIT, RAN: 318 mg/dL

## 2024-03-05 MED ORDER — ROSUVASTATIN 20 MG PO TAB
40 mg | Freq: Every day | ORAL | 0 refills | Status: DC
Start: 2024-03-05 — End: 2024-03-05

## 2024-03-05 MED ORDER — ISOSORBIDE MONONITRATE 30 MG PO TB24
30 mg | ORAL_TABLET | Freq: Every morning | ORAL | 3 refills | 90.00000 days | Status: AC
Start: 2024-03-05 — End: ?

## 2024-03-05 MED ORDER — SODIUM CHLORIDE 0.9% IV BOLUS
500 mL | Freq: Once | INTRAVENOUS | 0 refills | Status: CP
Start: 2024-03-05 — End: ?
  Administered 2024-03-05: 13:00:00 500 mL via INTRAVENOUS

## 2024-03-05 MED ORDER — ROSUVASTATIN 40 MG PO TAB
40 mg | ORAL_TABLET | Freq: Every day | ORAL | 3 refills | 90.00000 days | Status: AC
Start: 2024-03-05 — End: ?

## 2024-03-05 NOTE — Discharge Instructions - Pharmacy
 Discharge Summary      Name: [name]  Medical Record Number: [id number]        Account Number:  [id number]  Date Of Birth:  08/14/1961                         Age:  63 y.o.  Admit date:  03/03/2024                     Discharge date: 03/05/2024      Discharge Attending:  Dr. Hermenia  Discharge Summary Completed By: Norleen Hora, MD    Service: Neurology Critical Care    Reason for hospitalization:  Acute ischemic left MCA stroke (CMS-HCC) [phone]    Primary Discharge Diagnosis:   Same as Above    Hospital Diagnoses:  Hospital Problems        Active Problems    * (Principal) Acute ischemic left middle cerebral artery (MCA) stroke   (CMS-HCC)    Primary hypertension    PTSD (post-traumatic stress disorder)    Tissue plasminogen activator (tPA) administered at other facility within   24 hours before current admission    Expressive aphasia     Present on Admission:   PTSD (post-traumatic stress disorder)   Primary hypertension   Acute ischemic left middle cerebral artery (MCA) stroke (CMS-HCC)   Tissue plasminogen activator (tPA) administered at other facility within 24 hours before current admission   Expressive aphasia        Significant Past Medical History        Acid reflux  Allergy  Anemia  Atrial fibrillation (CMS-HCC)  CAD (coronary artery disease)  CHF (congestive heart failure) (CMS-HCC)  Diabetes (CMS-HCC)  GERD (gastroesophageal reflux disease)  Heart valve problem      Comment:  moderate-severe tricuspid valve regurgitation, mdoerate                MR  Hodgkin's lymphoma (CMS-HCC)  Hx pulmonary embolism  Hypertension  Neuropathy  Past myocardial infarction      Comment:  ptca x 1 stent  PTSD (post-traumatic stress disorder)  PVD (peripheral vascular disease)  Type II diabetes mellitus (CMS-HCC)    Allergies   Morphine    Brief Hospital Course   The patient was admitted and the following issues were addressed during this hospitalization: (with pertinent details including admission exam/imaging/labs).    [name] is a 63 y.o. male with a PMH of PTSD, EtOH abuse, HTN, CAD, Afib w/ RVR, PE on eliquis, spontaneous pneumothorax, Hodgkin's Lymphoma and T2DM who presented to [facility] when developed expressive aphasia while at work. LKW 1430 on 6/26 with an NIH of 2. Imaging demonstrated a left M2 occlusion and was given TNK at 1823. He was subsequently transferred to [facility] for further evaluation and management. MRI Head revealed a punctate area of stroke w/in the L MCA territory with additional chronic small vessel white matter disease. Stroke risk factors were assess and he was started on rosuvastatin for elevated LDL and he will need aggressive DM2 management for his uncontrolled DM2 with A1c 9.3. Pt reported not taking his PTA metformin. He continued to improve functionally with only residual waxing/waning of his aphasia. 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 Cardiology was consulted and recommended no closure of his PFO at this time as he is on life long AC but will follow up  with a structural cardiologist as an outpt. They recommended Imdur for anginal pain at discharge and to follow up with his OSH cardiologist.  Finally in regards to his GI Hx and need for procedures in the future, recommend for all future procedures he will need to be on bridge therapy to minimize time off AC. On 6/28 patient was appropriate for transition from the inpt setting to home with follow up as below. Patient expressed his thanks to the team for the care he received during this admission.      Day of discharge exam notable for:   Mental Status: Aox4, some word finding difficulty               Cranial Nerves: Cranial nerves 2-12 INTACT.                            - Pupil exam: PERRL                            - EOM: intact                            - symmetric smile, tongue midline, palate equal rise, facial sensation intact, shrug symmetric Motor:                     RUE: Strength: 5/5;                      RLE: Strength: 5/5;                      LUE: Strength: 5/5;                      LLE: Strength: 5/5;                Sensory: normal to LT               Coordination: normal FTN    Items Needing Follow Up   Pending items or areas that need to be addressed at follow up:   - PCP for DM2 management and post dc follow up  - Post dc follow up with neurology and 30 Day MCOT  - Post dc follow up with OSH cardiologist as well as structural cards to evaluate PFO  - Will need speech therapy for his aphasia amb referral sent    Pending Labs and Follow Up Radiology    Pending labs and/or radiology review at this time of discharge are listed below: Please note- any labs with collected status will not have a result; if this area is blank, there are no items for review.     None    Medications      Medication List      START taking these medications     isosorbide mononitrate ER 30 mg tablet,extended release 24 hr; Commonly   known as: IMDUR; Dose: 30 mg; Take one tablet by mouth every morning.   Indications: prevention of anginal chest pain associated with coronary   artery disease; For: prevention of anginal chest pain associated with   coronary artery disease; Quantity: 90 tablet; Refills: 3   rosuvastatin 40 mg tablet; Commonly known as: CRESTOR; Dose: 40 mg; Take   one tablet by mouth daily. Indications: excessive fat in the blood, stroke  prevention; For: excessive fat in the blood, stroke prevention; Quantity:   90 tablet; Refills: 3; Start taking on: March 06, 2024     CONTINUE taking these medications     carvediloL 12.5 mg tablet; Commonly known as: COREG; Dose: 12.5 mg;   Refills: 0   ELIQUIS 5 mg tablet; Generic drug: apixaban; Dose: 5 mg; Refills: 0   furosemide  20 mg tablet; Commonly known as: LASIX ; Take one tablet every   day as needed if you are noticing swelling in your legs or gaining water   weight rapidly; Quantity: 30 tablet; Refills: 0 gabapentin  300 mg capsule; Commonly known as: NEURONTIN ; Dose: 300 mg;   For: neuropathic pain; Refills: 0   potassium chloride  10 mEq tablet; Commonly known as: K-DUR; Dose: 10   mEq; Refills: 0   sucralfate 1 gram tablet; Commonly known as: CARAFATE; Dose: 1 g;   Refills: 0     STOP taking these medications     aspirin  EC 81 mg tablet; Commonly known as: ASPIR-LOW       Return Appointments and Scheduled Appointments   No appointment scheduled  FU as above    Consults, Procedures, Diagnostics, Micro, Pathology   Consults: Cardiology  Surgical Procedures & Dates: None  Significant Diagnostic Studies, Micro and Procedures: noted in brief hospital course  Significant Pathology: noted in brief hospital course                       Discharge Disposition, Condition   Patient Disposition: Home  Condition at Discharge: Stable    Code Status   Full Code    Patient Instructions     Activity       Activity as Tolerated   As directed      It is important to keep increasing your activity level after you leave the hospital.  Moving around can help prevent blood clots, lung infection (pneumonia) and other problems.  Gradually increasing the number of times you are up moving around will help you return to your normal activity level more quickly.  Continue to increase the number of times you are up to the chair and walking daily to return to your normal activity level. Begin to work towards your normal activity level at discharge.          Diet       Diabetic Diet   As directed      You should eat between 1600 and 2000 calories per day.  This is equal to 60g (grams) of carbohydrates per meal, and 30g of carbohydrates for a bedtime snack.    If you have questions about your diet after you go home, you can call a dietitian at [phone].             Discharge education provided to patient., Signs and Symptoms:   Report these signs and symptoms       Report These Signs and Symptoms   As directed      STROKE Call 911 for the following symptoms:    *Sudden numbness or weakness of the face, arm or leg, especially on one side of the body.  *Sudden confusion, trouble speaking or understanding.  *Sudden trouble seeing in one or both eyes.  *Sudden trouble walking, dizziness, loss of balance or coordination.  *Sudden, severe headache with no known cause.        , and Education:   Education       Risk Reduction Plan  As directed      Stroke Personal Risk Factor Reduction Plan  **Take this sheet to your physician to show treatment recommendations**    Depression Risk  Goal:    It is common to experience depression after a stroke.                  Plan:    If you experience symptoms of depression, please follow-up with your primary care physician.     High Blood Pressure Risk   Goal:    Keep blood pressure below 130/80  Your Numbers:     BP Readings from Last 1 Encounters:  03/05/24 : (!) 142/67        Plan:    High blood pressure is the single most important risk factor for stroke because it's the #1 cause of stroke.  Take medication as prescribed and monitor your blood pressure.    Atrial Fibrillation Risk   Goal:     This heart rhythm raises the risk of stroke. Anticoagulants may be prescribed.                    Your Numbers: INR       Date                     Value               Ref Range           Status                03/03/2024               1.1                 0.9 - 1.2           Final                 01/21/2021               1.0                 0.8 - 1.2           Final            ----------  Plan: Follow up with your Primary Care Physician to manage your anticoagulation therapy, if you have been diagnosed with atrial fibrillation.    Abnormal lipids (fats in blood) Risk  Goal: Total Cholesterol: <200  LDL (bad cholesterol): <100  HDL (good cholesterol): >40 for men, >50 for women  Triglycerides: <150  Your Numbers: Cholesterol       Date                     Value               Ref Range           Status                03/03/2024 233 (H)             <200 mg/dL          Final                 01/21/2021               220 (H)             <200 MG/DL  Final            ----------  LDL       Date                     Value               Ref Range           Status                03/03/2024               164 (H)             <100 mg/dL          Final                 01/21/2021               150 (H)             <100 mg/dL          Final            ----------  HDL       Date                     Value               Ref Range           Status                03/03/2024               47                  >40 mg/dL           Final                 01/21/2021               70                  >40 MG/DL           Final            ----------  Triglycerides       Date                     Value               Ref Range           Status                03/03/2024               168 (H)             <150 mg/dL          Final                 01/21/2021               79                  <150 MG/DL          Final            ----------  Plan: Diets high in saturated fat, trans fat and cholesterol can raise blood cholesterol levels increasing your risk of stroke.  Take medication as prescribed and  eat a heart healthy, low sodium diet.    Smoking Risk   Goals: If you smoke, STOP!      Plan: Smoking DOUBLES your risk of stroke. Quitting can greatly reduce your risk. To register for smoking cessation program call [phone] or visit www.smokefree.gov    Diabetes Risk   Goal: for Non-diabetic: Below 5.7%  Goal for diabetic: Less than 7%  Your Numbers: Hemoglobin A1C       Date                     Value               Ref Range           Status                03/03/2024               9.3 (H)             4.0 - 5.7 %         Final              Comment:    The ADA recommends that most patients with type 1 and type 2 diabetes maintain an A1c level <7%.       01/21/2021               7.3 (H)             4.0 - 6.0 %         Final              Comment:    The ADA recommends that most patients with type 1 and type 2 diabetes maintain     an A1c level <7%.      ----------  Plan: If you have diabetes, even if treated, you are at an increased risk of stroke. To minimize your risk, make sure to follow-up with a primary care provider or endocrinologist and check your blood sugars frequently.    Alcohol use Risk   Goal: Alcohol abuse can lead to stroke.  Plan: For men, limit intake to no more than 2 drinks per day.  For women, limit intake to no more than one drink per day.    Weight Management Risk  Goal: Healthy: BMI is 18.5 to 24.9  Overweight: BMI is 25 to 29.9  Obese: BMI is 30 or higher  Your Numbers: Your BMI (Calculated): 23.16  Plan: If you have questions about your diet after you go home, you can call a dietitian at [phone]    Physical Activity Risk  Goal: Stroke patients should have approval by a physician prior to beginning an exercise program.  Plan: Try to get at least 30 minutes of moderate physical activity five days a week or 20 minutes of vigorous physical activity three days a week with your doctor's approval.      B.E. F.A.S.T.  Reminder for Stroke Signs  B.E.F.A.S.T. is an easy way to remember the warning signs of a stroke, and what to do if someone near you is experiencing them.      B.E. F.A.S.T. Stands for...  B  -  Balance  Is there a sudden difficulty with balance or coordination? Is walking or sitting difficult?  E  -  Eyes  Is there a sudden change in eyesight such as blurred vision, double vision, or a loss of vision in one or both eyes without pain?  F  -  Face   Does one side of the face droop or is it numb?  Ask the person to smile.  A  -  Arm   Is one arm weak or numb?  Ask the person to raise both arms.  Does one arm drift downward?  S  -  Speech   Is speech slurred or difficult to understand?  Are they unable to speak at all?  Ask the person to repeat is simple sentence like It is a bright and sunny day.    T  -  Time to call 911!  If a person shows any of these symptoms, even if they go away, call 911 to get them to the hospital immediately.  Time is very important.  The sooner they get to the hospital, the more likely they are to receive stroke reversing and potentially life-saving treatment            Additional Orders: Case Management, Supplies, Home Health     Home Health/DME       None              Signed:  Norleen Hora, MD  03/05/2024      cc:  Primary Care Physician:  Dovie Elsie NOVAK   Verified    Referring physicians:  Unknown, Unknown, MD   Additional provider(s):        Did we miss something? If additional records are needed, please fax a request on office letterhead to [phone]. Please include the patient's name, date of birth, fax number and type of information needed. Additional request can be made by email at [email or web address]. For general questions of information about electronic records sharing, call [phone].

## 2024-03-05 NOTE — Consults
 Cardiology Consultation Note      Admission Date: 03/03/2024                                                LOS: 2 days    Reason for Consult:  PFO, Anginal chest pain    History of Present Illness:  [name] is a 63 y.o. male with a PMH of PTSD, EtOH abuse, HTN, CAD, Afib w/ RVR, PE on eliquis, spontaneous pneumothorax, Hodgkin's Lymphoma and T2DM who presented to [facility] when developed expressive aphasia while at work. LKW 1430 on 6/26 with an NIH of 2. Imaging demonstrated a left M2 occlusion and was given TNK at 1823. He was subsequently transferred to [facility] for further evaluation and management.      He follows with [name] in [facility].  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  Recent multiple, bilateral lower lobe PE via CT chest November 2024     Patient complained to me about left-sided and right-sided chest pain on coughing and taking a deep breath.  He denies any exertional dyspnea.  He still has difficulty finding words.  He presented on 03/03/2024 to [facility] with expressive aphasia while at work.  He was found on imaging to have left M2 occlusion and was given TNK.  He was subsequently transferred to [facility].  As part of the evaluation he was found to have a PFO.  And due to the complaints of occasional chest pain cardiology was consulted to evaluate.    Pertinent radiology reviewed., Last Images past 24 hours:   Radiology  (Last 24 hours)                 03/04/24 1653  US  VENOUS DOPPLER BILATERAL Final result    Impression:          No femoral or popliteal deep venous thrombosis in either leg.            Finalized by [name]. [name], M.D. on 03/04/2024 4:59 PM. Dictated by [name]. [name], M.D. on 03/04/2024 4:59 PM.       03/04/24 1426  CHEST SINGLE VIEW Final result    Impression:          No acute cardiopulmonary abnormality.       By my electronic signature, I attest that I have personally reviewed the images for this examination and formulated the interpretations and opinions expressed in this report            Finalized by [name], M.D. on 03/04/2024 3:27 PM. Dictated by Daniel Gal, MD on 03/04/2024 2:56 PM.               ECG: NSR, No ST T wave changes    Echocardiogram:    Normal left ventricular  size with hyperdynamic systolic function.  LVEF of 70% by Simpson's biplane method.  Flattening of the interventricular septum suggestive of right ventricular pressure/volume overload.  Normal right ventricular size and systolic function.  Mild biatrial dilatation.  Early right to left interatrial shunting by agitated saline contrast bubbles, suggestive of patent foramen ovale.  Sclerotic/calcified aortic valve and aortic annulus.  Mild aortic stenosis.  Mild to moderate aortic regurgitation.  Nonspecific mitral valve thickening with mild mitral stenosis.  Mean inflow gradient of 5-6 mmHg at 82 bpm.  Moderate to probably severe mitral regurgitation.  Severe tricuspid regurgitation.  Estimated CVP of 5-10 mmHg (although IVC may be incompletely visualized and CVP possibly underestimated).  Estimated PASP of 52 mmHg.  No pericardial effusion.     Compared to prior study on 01/22/2021, there has been interval worsening of valvular lesions as described above.     Stress 2022;   This study is technically limited given demonstration the patient's underlying atrial fibrillation with rapid ventricular response as well as demonstration of significant motion artifact throughout the course of the study. There is demonstration of a small sized, mild intensity perfusion defect involving the distal inferoseptal, inferior myocardial territories seen most prominently on the supine stress tomographic datasets. However, this perfusion abnormality is not really appreciated on the supine upright datasets suggestive of a shifting soft tissue attenuation artifact. Moreover, review of gated imaging there does appear to be some preservation of myocardial thickening and brightening. Left ventricular systolic function is normal. There is demonstration of a borderline elevated lung to heart ratio. The pharmacologic ECG portion of the study is nondiagnostic for ischemia. The patient did have atrial fibrillation with rapid ventricular response with what appears to be spontaneous cardioversion to a sinus rhythm with PACs in recovery.     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.  He was admitted with acute ischemic stroke that seems to be cardioembolic.  He was reportedly on apixaban when this happened.  Ultimately an echocardiogram did not show any LV thrombus.  He has history of atrial fibrillation and therefore this should be presumed to be cardioembolic.  However our consultation today was to make comments on possibility of PFO closure as well as antianginals for possibly anginal chest pain.  I reviewed the patient's history, physical examination, imaging studies done so far, echocardiogram, EKG and laboratory findings.  His chest pain seems pleuritic in nature and he stated that sublingual nitrate did not help.  His troponin is negative at this time and EKG is unremarkable for ischemia.  The patient has good cardiology and outpatient follow-up and we would suggest he follows with his primary cardiologist as he is being discharged today.  We suggest starting him on Imdur 30 mg daily for angina, if blood pressure allows.  In terms of PFO closure, we suggest setting him up with our  structural team as an outpatient to be evaluated for possible closure of the PFO.   For his valvular heart disease, we suggest setting him up again with the structural team to evaluate and follow him up for possible future interventions for his valvular diseases.  This can be done by his outpatient cardiologist  Patient has been resumed on his anticoagulation for stroke prevention.  However he seems to have been on apixaban when this stroke happened.  There is need to evaluate whether this was apixaban failure.  Continue rosuvastatin 20 mg daily.    Thank you for the consultation, I saw the patient with my attending Daniel. [place].    Luann Cooler, MBChB  Cardiovascular Diseases Fellow  Available on Voalte    _________________________________________________________________      Past Medical History:    Acid reflux    Allergy    Anemia    Atrial fibrillation (CMS-HCC)    CAD (coronary artery disease)    CHF (congestive heart failure) (CMS-HCC)    Diabetes (CMS-HCC)    GERD (gastroesophageal reflux disease)    Heart valve problem    Hodgkin's lymphoma (CMS-HCC)    Hx pulmonary embolism    Hypertension    Neuropathy    Past myocardial infarction    PTSD (post-traumatic stress disorder)    PVD (peripheral vascular disease)    Type II diabetes mellitus (CMS-HCC)     Surgical History:   Procedure Laterality Date    CARDIAC CATHERIZATION  09/2021    DES to Diag LAD    ESOPHAGOGASTRODUODENOSCOPY WITH ENDOSCOPIC MUCOSAL RESECTION - FLEXIBLE N/A 11/13/2023    Performed by Rogers Kipper, MD at [facility] ENDO    ESOPHAGOGASTRODUODENOSCOPY WITH BIOPSY - FLEXIBLE N/A 11/13/2023    Performed by Rogers Kipper, MD at [facility] ENDO    ABDOMINAL EXPLORATION SURGERY  1981    Hodgkins    ATRIAL ABLATION SURGERY      COLONOSCOPY      ESOPHAGEAL DILATATION      GASTROSTOMY TUBE PLACEMENT      HX APPENDECTOMY  1981    HX CHOLECYSTECTOMY      HX ENDOSCOPY  1995    Partial pancreas    PANCREATECTOMY      PERICARDIUM SURGERY      UPPER GASTROINTESTINAL ENDOSCOPY       Social History     Tobacco Use    Smoking status: Never    Smokeless tobacco: Never   Vaping Use    Vaping status: Never Used   Substance Use Topics    Alcohol use: Not Currently     Comment: last drink fall 2024    Drug use: Never      Family History   Problem Relation Name Age of Onset    Cancer-Colon Mother Father     Cancer-Colon Father Father     Cancer-Colon Brother Brother         Allergies:  Morphine    Scheduled Meds:apixaban (ELIQUIS) tablet 5 mg, 5 mg, Oral, BID  docusate (COLACE) capsule 100 mg, 100 mg, Oral, BID  insulin aspart (U-100) (NOVOLOG FLEXPEN U-100 INSULIN) injection PEN 0-12 Units, 0-12 Units, Subcutaneous, ACHS (22)  metoprolol  tartrate tablet 12.5 mg, 12.5 mg, Oral, BID  milk of magnesium  oral suspension 30 mL, 30 mL, Oral, QDAY  rosuvastatin (CRESTOR) tablet 20 mg, 20 mg, Oral, QDAY  sennosides-docusate sodium  (SENOKOT-S) tablet 1 tablet, 1 tablet, Oral, BID    Continuous Infusions:  PRN and Respiratory Meds:acetaminophen Q4H PRN, calcium gluconate  IV PRN (On Call from Rx) **AND** Ionized Calcium PRN **AND** Notify Physician Ongoing, dextrose 50% (D50) IV PRN, hydrALAZINE Q6H PRN, labetalol (NORMODYNE; TRANDATE) injection Q15 MIN PRN, magnesium  sulfate PRN **AND** Magnesium  PRN **AND** Notify Physician Ongoing, nitroglycerin  Q5 MIN PRN, ondansetron  (ZOFRAN ) IV Q6H PRN, potassium chloride  PRN **OR** potassium chloride  PRN **OR** potassium chloride  in water PRN    Review of Systems:  All other systems reviewed and are negative.      Intake/Output Summary (Last 24 hours) at 03/05/2024 1002  Last data filed at 03/05/2024 0800  Gross per 24 hour   Intake 2475 ml   Output 2650 ml   Net -175 ml        Vital Signs:  Last Filed in 24 hours Vital Signs:  24 hour Range    BP: 142/67 (06/28 0800)  Temp: 36.9 ?C (98.4 ?F) (06/28 0800)  Pulse: 97 (06/28 0800)  Respirations: 21 PER MINUTE (06/28 0800)  SpO2: 94 % (06/28 0600)  O2 Device: None (Room air) (06/28 0851) BP: (120-181)/(67-97)   Temp:  [36.3 ?C (97.4 ?F)-36.9 ?C (98.4 ?F)]   Pulse:  [68-97]   Respirations:  [11 PER MINUTE-23 PER MINUTE]   SpO2:  [92 %-97 %]   O2 Device: None (Room air)     Vitals:    03/04/24 0500 03/04/24 0700 03/05/24 0500   Weight: 75.5 kg (166 lb 7.2 oz) 75.3 kg (166 lb) 84.1 kg (185 lb 6.5 oz)      Body mass index is 25.87 kg/m?[name]    Physical Exam:    General Appearance: calm, cooperative, no distress   Eyes: conjunctivae and lids normal, EOMI  Neck: no JVD, no thyromegaly   Cardiovascular system: regular, S1 S2 normal, systolic murmur in the mitral area radiating to the axilla, systolic murmur in the aortic position radiating to the neck   Respiratory system: Bilateral breath sounds, normal rate.  Extremities: no peripheral edema, peripheral pulses intact  Abdomen: soft, non-tender, no masses, bowel sounds normal, no organomegaly   Neurologic: AAOx4, no focal deficits noted    Lab/Radiology/Other Diagnostic Tests:  CBC w/Diff    Lab Results   Component Value Date/Time    WBC 9.30 03/05/2024 04:08 AM    RBC 3.49 (L) 03/05/2024 04:08 AM    HGB 10.5 (L) 03/05/2024 04:08 AM    HCT 30.7 (L) 03/05/2024 04:08 AM    MCV 88.1 03/05/2024 04:08 AM MCH 30.1 03/05/2024 04:08 AM    MCHC 34.2 03/05/2024 04:08 AM    RDW 15.0 03/05/2024 04:08 AM    PLTCT 315 03/05/2024 04:08 AM    MPV 7.1 03/05/2024 04:08 AM    Lab Results   Component Value Date/Time    NEUT 67.4 03/05/2024 04:08 AM    ANC 6.30 03/05/2024 04:08 AM    LYMA 18.1 (L) 03/05/2024 04:08 AM    ALC 1.70 03/05/2024 04:08 AM    MONA 11.4 03/05/2024 04:08 AM    AMC 1.10 (H) 03/05/2024 04:08 AM    EOSA 2.3 03/05/2024 04:08 AM    AEC 0.20 03/05/2024 04:08 AM    BASA 0.8 03/05/2024 04:08 AM    ABC 0.10 03/05/2024 04:08 AM       Comprehensive Metabolic Profile    Lab Results   Component Value Date/Time    NA 131 (L) 03/05/2024 04:08 AM    K 3.7 03/05/2024 04:08 AM    CL 102 03/05/2024 04:08 AM    CO2 21 03/05/2024 04:08 AM  GAP 8 03/05/2024 04:08 AM    BUN 7 03/05/2024 04:08 AM    CR 0.57 03/05/2024 04:08 AM    GLU 156 (H) 03/05/2024 04:08 AM    Lab Results   Component Value Date/Time    CA 7.8 (L) 03/05/2024 04:08 AM    PO4 2.5 03/05/2024 04:08 AM    ALBUMIN 3.8 03/03/2024 09:43 PM    TOTPROT 6.8 03/03/2024 09:43 PM    ALKPHOS 120 (H) 03/03/2024 09:43 PM    AST 28 03/03/2024 09:43 PM    ALT 20 03/03/2024 09:43 PM    TOTBILI 0.8 03/03/2024 09:43 PM    GFR >60 03/05/2024 04:08 AM          Lab Results   Component Value Date/Time    TNI 0.02 01/21/2021 12:35 PM         Lab Results   Component Value Date    LDL 164 (H) 03/03/2024    HDL 47 03/03/2024    CHOL 766 (H) 03/03/2024    TRIG 168 (H) 03/03/2024    AST 28 03/03/2024    ALT 20 03/03/2024         Braxton Vantrease, MBChB  Cardiovascular Diseases Fellow  Available on Voalte

## 2024-03-05 NOTE — Progress Notes
 Neuro Critical Care Progress Note          [name]  Admission Date: 03/03/2024  LOS: 2 days  Full Code                      ASSESSMENT/PLAN     Patient Active Problem List    Diagnosis Date Noted    Acute ischemic left middle cerebral artery (MCA) stroke (CMS-HCC) 03/03/2024    Tissue plasminogen activator (tPA) administered at other facility within 24 hours before current admission 03/03/2024    Expressive aphasia 03/03/2024    Atrial fibrillation with rapid ventricular response (CMS-HCC) 01/23/2021    PTSD (post-traumatic stress disorder) 01/23/2021    Tricuspid regurgitation 01/23/2021    Chest pain 01/21/2021    Primary hypertension 01/21/2021    Chest pain 01/21/2021     [name] is a 63 y.o. male with a PMH of PTSD, EtOH abuse, HTN, CAD, Afib w/ RVR, PE on eliquis, spontaneous pneumothorax, Hodgkin's Lymphoma and T2DM who presented to [facility] when developed expressive aphasia while at work. LKW 1430 on 6/26 with an NIH of 2. Imaging demonstrated a left M2 occlusion and was given TNK at 1823. He was subsequently transferred to [facility] for further evaluation and management.       Hospital and ICU course:   6/26: Admitted to NEICU. Stroke work-up intitated  6/27: NAE, aphasia significantly improved, uncontrolled DM2  6/28: Cardiology to evaluate for PFO intervention; some waxing/waning expressive aphasia otherwise stable     Neuro:   Acute Left M2 Occlusion  S/P TNK  Aphasia--waxing waning     Stroke Symptom Onset time: 1430 on 03/03/24  TPA given at: 1823 on 03/03/24  Initial NIH: 2  NIH post TPA: 1  IR: No  Follow up Imaging:  MRI wo     Ischemic Stroke Risk Factors    Risk factor Present?  Target Patient at target? Comments   1. Hypertension Yes BP <180/105  Yes     2. Diabetes  Yes  HBA1C<7  Pending     3. Dyslipidemia Yes LDL<70? Pending     4. H/o stroke/TIA No         5. Atrial fibrillation Yes            If yes, anticoag? Yes, Eliquis            If no anticoagulation, why not?           6. Tobacco abuse No Quit date  N/A            - If afib/flutter present, is patient on anticoagulation? Yes/no    - if not, why not?     PLAN  - Stroke risk factor modification:    -- SBP <180    -- LDL goal < 70, LDL 164, started rosuvastatin 20mg  qDay     -- A1c goal < 7.0, A1c 9.3, Diabetic educator consulted    -- [facility] plan: resume PTA eliquis tonight     -- TTE: LVEF 70%, Early R-->L interatrial shunting suggestive of PFO       --BLE doppler w/o evidence of thrombus    -- Smoking cessation if indicated    -- Diabetic diet    -- Rehab Medicine, PT, OT, SLP consulted   - Neuro-ICU monitoring, neurochecks q 1 hrs  - Will need Neurology clinic visit outpt for post stroke follow up     Imaging:  MRI Head WO:  1. Punctate focus of acute infarct deep to the anterior left insula.   Additional questionable tiny area of diffusion restriction at the left   frontal cortex. Otherwise no significant diffusion abnormality to suggest   additional acute infarct elsewhere   2. Old bilateral cerebellar infarcts.   3. Moderate chronic cerebral microvascular ischemic changes.     CT Head @ OSH  - Negative for acute intracranial abnormality     CTA Head/Neck @ OSH  - Occlusion of left MCA.        Sedation/Pain Management:   H/O PTSD  H/O EtOH Abuse  - PRN: APAP  - Assess for delirium daily  - UDS pending   - AWAS monitoring q4       Cardiac:   HTN  CAD  H/O Afib w/ RVR  H/O Pulmonary Embolism (07/2023)  On Eliquis  - BP <180/105  - MAP goal > 65  - PRN: labetalol & hydralazine  - hold PTA: coreg 12.5mg  BID  - Started on metoprolol  while inpt for permissive HTN  - EKG: NSR               > QT/QTc: 372/462  - TTE w/ bubble study as above  - eliquis held for GI scope procedure    --In the future will need to be on bridged therapy with therapeutic lovenox if procedure planned     Respiratory:    H/O Spontaneous Pneumothorax  - stabel on RA  - PaO2 goal >100, Spo2 goal >95%     GI:  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)  - Feeding: NPO pending RN bedside swallow eval  - neuro bowel regimen, ensure daily BM        Lab Results   Component Value Date     AST 15 01/23/2021     ALT 17 01/23/2021     ALKPHOS 130 (H) 01/23/2021         Heme/Onc:  H/O Hodgkin's Lymphoma   - Daily CBC  - VTE prophylaxis: Mechanical prophylaxis; Sequential compression device  - assess for coagulopathy, maintain platelets above 100k, INR <1.5        Lab Results   Component Value Date     HGB 12.8 (L) 11/13/2023     HCT 37.5 (L) 11/13/2023     PLTCT 438 (H) 11/13/2023     MCV 90.1 11/13/2023     MCHC 34.2 11/13/2023     PT 10.8 01/21/2021     INR 1.0 01/21/2021     PTT 65.1 (H) 01/22/2021      ID:   - Tmax 36.8   - aim for normothermia, Temp <38.3 celsius, normothermia protocol if febrile        Lab Results   Component Value Date     WBC 9.70 11/13/2023      Renal:   - Monitor hourly I/O balance   - Aim for normovolemia        Lab Results   Component Value Date     CR 0.69 01/23/2021     BUN 9 01/23/2021     CO2 27 01/23/2021     GAP 9 01/23/2021      Endocrine:    Uncontrolled T2DM  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)  - HgbA1c 9.3  - Pt not taking PTA metformin  - Blood glucose goal 100-180mg /dl  - glucose checks 5xDaily; LDCF      FEN:   - IVF: NS @ 75 ml/h  - Daily  BMP, Mag, Phos & iCal  - Critical care electrolyte replacement protocol  - Magnesium  goal >2.0, i-Cal goal > 1.0, Potassium goal >4.0 mEq/L        Lab Results   Component Value Date     NA 138 01/23/2021     K 3.8 01/23/2021     MG 1.5 (L) 01/23/2021         Prophylaxis Review:   A) GI: None  B) Lines:  None  C) Urinary Catheter:  None  D) Antibiotic Usage:  None  E) VTE:  Mechanical prophylaxis; Sequential compression device  F) Isolation: None  G)Seizures: None  H) Restraints: Patient assessed for need for restraints.   I) Disposition/Family:   Needs ICU      Primary service: NCC     Consults:  PT, OT, SLP, DM2 educator    Patient was seen and discussed with Dr. Hermenia.    Norleen Hora, MD  PGY2 Neurology    ___________________________________________________________________  [name]. Pt aphasia overall improved from presentation. However, pt had a ~62min episode of worsening aphasia and R hand numbness that resolved spontaneously. He already resumed BID Eliquis and suspect his episode is due to his known area of stroke. His neurologic exam was otherwise normal before/during/and after his episode. Discussed plan as above with pt and he was agreeable.     OBJECTIVE                     Vital Signs: Last Filed                  Vital Signs: 24 Hour Range   BP: 166/81 (06/28 0600)  Temp: 36.9 ?C (98.4 ?F) (06/28 0400)  Pulse: 92 (06/28 0600)  Respirations: 19 PER MINUTE (06/28 0600)  SpO2: 94 % (06/28 0600)  O2 Device: None (Room air) (06/28 0600)  Height: 180.3 cm (5' 10.98) (06/27 0700)  Weight: 84.1 kg (185 lb 6.5 oz) (06/28 0500)  BP: (114-181)/(68-97)   Temp:  [36.3 ?C (97.4 ?F)-36.9 ?C (98.4 ?F)]   Pulse:  [68-92]   Respirations:  [11 PER MINUTE-23 PER MINUTE]   SpO2:  [92 %-97 %]   O2 Device: None (Room air)    Intensity Pain Scale (Self Report): (not recorded) Vitals:    03/04/24 0500 03/04/24 0700 03/05/24 0500   Weight: 75.5 kg (166 lb 7.2 oz) 75.3 kg (166 lb) 84.1 kg (185 lb 6.5 oz)         Artificial airway:  None                  Lines:  PIVx2  Drains:     Critical Care Vitals:      ICP Monitoring:     Hemodynamics/Oxycalcs:       Intake/Output Summary:  (Last 24 hours)    Intake/Output Summary (Last 24 hours) at 03/05/2024 0647  Last data filed at 03/05/2024 0600  Gross per 24 hour   Intake 2617 ml   Output 2650 ml   Net -33 ml                Physical Exam:    Blood pressure (!) 166/81, pulse 92, temperature 36.9 ?C (98.4 ?F), height 180.3 cm (5' 10.98), weight 84.1 kg (185 lb 6.5 oz), SpO2 94%.    Glasgow coma score:              E: 4 - Opens eyes on own  M: 6 - Follows simple motor commands             V: 5 - Alert and oriented    Neuro:   Mental Status: Aox4, some word finding difficulty   Cranial Nerves: Cranial nerves 2-12 INTACT.      - Pupil exam: PERRL    - EOM: intact    - symmetric smile, tongue midline, palate equal rise, facial sensation intact, shrug symmetric   Motor:         RUE: Strength: 5/5;                      RLE: Strength: 5/5;             LUE: Strength: 5/5;          LLE: Strength: 5/5;    Sensory: normal to LT   Coordination: normal FTN       Lungs: clear to auscultation bilaterally                     Heart: Regular rate  Abdomen: soft, non-tender. Bowel sounds normal. No masses,  no organomegaly  Extremities: extremities normal, atraumatic, no cyanosis or edema  Skin: Skin color, texture, turgor normal. No rashes or lesions    Point of Care Testing:  (Last 24 hours)  FSBS (Manual): (!) 134 (03/04/24 1757)  Glucose: (!) 156 (03/05/24 0408)  POC Glucose (Download): (!) 141 (03/05/24 0604)    Lab Review:  24-hour labs:    Results for orders placed or performed during the hospital encounter of 03/03/24 (from the past 24 hours)   POC GLUCOSE    Collection Time: 03/04/24 11:06 AM   Result Value Ref Range    Glucose, POC 220 (H) 70 - 100 mg/dL   HIGH SENSITIVITY TROPONIN I 0 HOUR    Collection Time: 03/04/24  3:29 PM   Result Value Ref Range    hs Troponin I 0 Hour 10 <20 ng/L   POC GLUCOSE    Collection Time: 03/04/24  5:55 PM   Result Value Ref Range    Glucose, POC 134 (H) 70 - 100 mg/dL   HIGH SENSITIVITY TROPONIN I 2 HOUR    Collection Time: 03/04/24  6:00 PM   Result Value Ref Range    hs Troponin I 2 Hour 11 <20 ng/L    hs Troponin I 2-0hr Delta Value 1    HIGH SENSITIVITY TROPONIN I 4 HR    Collection Time: 03/04/24  7:57 PM   Result Value Ref Range    hs Troponin I 4 Hour 9 <20 ng/L    hs Troponin I 4-2hr Delta Value -2    BASIC METABOLIC PANEL    Collection Time: 03/05/24  4:08 AM   Result Value Ref Range    Sodium 131 (L) 137 - 147 mmol/L    Potassium 3.7 3.5 - 5.1 mmol/L    Chloride 102 98 - 110 mmol/L    Glucose 156 (H) 70 - 100 mg/dL    Blood Urea Nitrogen 7 7 - 25 mg/dL    Creatinine 9.42 9.59 - 1.24 mg/dL    Calcium 7.8 (L) 8.5 - 10.6 mg/dL    CO2 21 21 - 30 mmol/L    Anion Gap 8 3 - 12    Glomerular Filtration Rate (GFR) >60 >60 mL/min   CBC AND DIFF    Collection Time: 03/05/24  4:08 AM   Result Value Ref Range  White Blood Cells 9.30 4.50 - 11.00 10*3/uL    Red Blood Cells 3.49 (L) 4.40 - 5.50 10*6/uL    Hemoglobin 10.5 (L) 13.5 - 16.5 g/dL    Hematocrit 69.2 (L) 40.0 - 50.0 %    MCV 88.1 80.0 - 100.0 fL    MCH 30.1 26.0 - 34.0 pg    MCHC 34.2 32.0 - 36.0 g/dL    RDW 84.9 88.9 - 84.9 %    Platelet Count 315 150 - 400 10*3/uL    MPV 7.1 7.0 - 11.0 fL    Neutrophils 67.4 41.0 - 77.0 %    Lymphocytes 18.1 (L) 24.0 - 44.0 %    Monocytes 11.4 4.0 - 12.0 %    Eosinophils 2.3 0.0 - 5.0 %    Basophils 0.8 0.0 - 2.0 %    Absolute Neutrophil Count 6.30 1.80 - 7.00 10*3/uL    Absolute Lymph Count 1.70 1.00 - 4.80 10*3/uL    Absolute Monocyte Count 1.10 (H) 0.00 - 0.80 10*3/uL    Absolute Eosinophil Count 0.20 0.00 - 0.45 10*3/uL    Absolute Basophil Count 0.10 0.00 - 0.20 10*3/uL    MDW (Monocyte Distribution Width) 16.6 <=20.6   IONIZED CALCIUM    Collection Time: 03/05/24  4:08 AM   Result Value Ref Range    Ionized Calcium 0.81 (L) 1.00 - 1.30 mmol/L   MAGNESIUM     Collection Time: 03/05/24  4:08 AM   Result Value Ref Range    Magnesium  2.2 1.6 - 2.6 mg/dL   PHOSPHORUS    Collection Time: 03/05/24  4:08 AM   Result Value Ref Range    Phosphorus 2.5 2.0 - 4.5 mg/dL   POC GLUCOSE    Collection Time: 03/05/24  6:04 AM   Result Value Ref Range    Glucose, POC 141 (H) 70 - 100 mg/dL   SPECIFIC GRAVITY-URINE RANDOM    Collection Time: 03/05/24  6:05 AM   Result Value Ref Range    Specific Gravity 1.011 1.005 - 1.030       Radiology and Other Diagnostic Procedures Review:  Pertinent radiologic and diagnostic procedures reviewed.

## 2024-03-06 ENCOUNTER — Encounter: Admit: 2024-03-06 | Discharge: 2024-03-06 | Payer: BLUE CROSS/BLUE SHIELD

## 2024-03-06 ENCOUNTER — Emergency Department: Admit: 2024-03-06 | Discharge: 2024-03-06 | Payer: BLUE CROSS/BLUE SHIELD | Attending: Neurology

## 2024-03-06 LAB — KC ED MAIN ECG TRIAGE ONLY
P-R INTERVAL: 168 ms
VENTRICULAR RATE: 94 {beats}/min

## 2024-03-06 LAB — POC INR
UKH BKR PROTIME POC: 12
~~LOC~~ BKR POC INR: 1 — ABNORMAL LOW (ref 2.0–3.0)

## 2024-03-06 LAB — CBC AND DIFF
~~LOC~~ BKR ABSOLUTE BASO COUNT: 0.3 10*3/uL — ABNORMAL HIGH (ref 0.00–0.20)
~~LOC~~ BKR RBC COUNT: 3.9 10*6/uL — ABNORMAL LOW (ref 4.40–5.50)
~~LOC~~ BKR WBC COUNT: 9.6 10*3/uL (ref 4.50–11.00)

## 2024-03-06 LAB — COMPREHENSIVE METABOLIC PANEL
~~LOC~~ BKR ALBUMIN: 4.1 g/dL (ref 3.5–5.0)
~~LOC~~ BKR ALK PHOSPHATASE: 130 U/L — ABNORMAL HIGH (ref 25–110)
~~LOC~~ BKR ALT: 16 U/L (ref 7–56)
~~LOC~~ BKR ANION GAP: 12 10*3/uL (ref 3–12)
~~LOC~~ BKR AST: 21 U/L — ABNORMAL HIGH (ref 7–40)
~~LOC~~ BKR BLD UREA NITROGEN: 7 mg/dL (ref 7–25)
~~LOC~~ BKR CALCIUM: 9.3 mg/dL (ref 8.5–10.6)
~~LOC~~ BKR CHLORIDE: 96 mmol/L — ABNORMAL LOW (ref 98–110)
~~LOC~~ BKR CO2: 27 mmol/L (ref 21–30)
~~LOC~~ BKR CREATININE: 0.8 mg/dL (ref 0.40–1.24)
~~LOC~~ BKR GLOMERULAR FILTRATION RATE (GFR): >60 mL/min (ref >60–0.45)
~~LOC~~ BKR GLUCOSE, RANDOM: 144 mg/dL — ABNORMAL HIGH (ref 70–100)
~~LOC~~ BKR POTASSIUM: 4 mmol/L — ABNORMAL HIGH (ref 3.5–5.1)
~~LOC~~ BKR SODIUM, SERUM: 135 mmol/L — ABNORMAL LOW (ref 137–147)
~~LOC~~ BKR TOTAL BILIRUBIN: 1.2 mg/dL — ABNORMAL LOW (ref 0.2–1.3)
~~LOC~~ BKR TOTAL PROTEIN: 7.2 g/dL (ref 6.0–8.0)

## 2024-03-06 LAB — HIGH SENSITIVITY TROPONIN I 0 HOUR: ~~LOC~~ BKR HIGH SENSITIVITY TROPONIN I 0 HOUR: 7 ng/L (ref <20)

## 2024-03-06 LAB — PTT (APTT): ~~LOC~~ BKR PTT: 28 s — ABNORMAL LOW (ref 24.0–36.5)

## 2024-03-06 LAB — POC CREATININE: ~~LOC~~ BKR POC CREATININE: 0.8 mg/dL (ref 0.4–1.24)

## 2024-03-06 LAB — HIGH SENSITIVITY TROPONIN I 4 HR
~~LOC~~ BKR HI SEN TNI DELTA 4-2: 1
~~LOC~~ BKR HIGH SENSITIVITY TROPONIN I 4 HOUR: 9 ng/L (ref <20)

## 2024-03-06 LAB — HIGH SENSITIVITY TROPONIN I 2 HOUR
~~LOC~~ BKR HIGH SENSITIVITY TROPONIN I 2 HOUR: 8 ng/L (ref <20)
~~LOC~~ BKR HIGH SENSITIVITY TROPONIN I DELTA VALUE: 1

## 2024-03-06 MED ORDER — ACETAMINOPHEN 650 MG RE SUPP
650 mg | RECTAL | 0 refills | Status: DC | PRN
Start: 2024-03-06 — End: 2024-03-07

## 2024-03-06 MED ORDER — ISOSORBIDE MONONITRATE 30 MG PO TB24
30 mg | Freq: Every morning | ORAL | 0 refills | Status: DC
Start: 2024-03-06 — End: 2024-03-07
  Administered 2024-03-06 – 2024-03-07 (×2): 30 mg via ORAL

## 2024-03-06 MED ORDER — APIXABAN 5 MG PO TAB
5 mg | Freq: Two times a day (BID) | ORAL | 0 refills | Status: DC
Start: 2024-03-06 — End: 2024-03-07

## 2024-03-06 MED ORDER — GABAPENTIN 300 MG PO CAP
300 mg | Freq: Two times a day (BID) | ORAL | 0 refills | Status: DC
Start: 2024-03-06 — End: 2024-03-07
  Administered 2024-03-06 – 2024-03-07 (×3): 300 mg via ORAL

## 2024-03-06 MED ORDER — METOPROLOL TARTRATE 25 MG PO TAB
12.5 mg | Freq: Two times a day (BID) | ORAL | 0 refills | Status: DC
Start: 2024-03-06 — End: 2024-03-07
  Administered 2024-03-06 – 2024-03-07 (×2): 12.5 mg via ORAL

## 2024-03-06 MED ORDER — ACETAMINOPHEN 325 MG PO TAB
650 mg | ORAL | 0 refills | Status: DC | PRN
Start: 2024-03-06 — End: 2024-03-07
  Administered 2024-03-07 (×2): 650 mg via ORAL

## 2024-03-06 MED ORDER — SODIUM CHLORIDE 0.9% IV SOLP
INTRAVENOUS | 0 refills | Status: AC
Start: 2024-03-06 — End: ?
  Administered 2024-03-06 – 2024-03-07 (×2): 1000.0000 mL via INTRAVENOUS

## 2024-03-06 MED ORDER — ROSUVASTATIN 20 MG PO TAB
40 mg | Freq: Every day | ORAL | 0 refills | Status: DC
Start: 2024-03-06 — End: 2024-03-07
  Administered 2024-03-06: 21:00:00 40 mg via ORAL

## 2024-03-06 MED ORDER — BISACODYL 10 MG RE SUPP
10 mg | Freq: Every day | RECTAL | 0 refills | Status: DC | PRN
Start: 2024-03-06 — End: 2024-03-07

## 2024-03-06 MED ADMIN — SODIUM CHLORIDE 0.9 % IJ SOLN [7319]: 50 mL | INTRAVENOUS | @ 15:00:00 | Stop: 2024-03-06 | NDC 00409488820

## 2024-03-06 MED ADMIN — IOHEXOL 350 MG IODINE/ML IV SOLN [81210]: 60 mL | INTRAVENOUS | @ 15:00:00 | Stop: 2024-03-06 | NDC 00407141491

## 2024-03-06 NOTE — Progress Notes
 Vascular Neurology Attending Note: Please see acute stroke note per neurology NP for more details.     [name] is a 63 year old man with history of HTN, DM2, CAD, PE and Afib on Eliquis , and esophageal strictures who had stopped anticoagulation on 6/21 in preparation for EGD, who presented on 6/25 with acute aphasia, s/p tNK and transferred to [place] for potential evaluation for L M2 occlusion, no intervention done as symptoms mostly resolved--was discharged from this admission 6/28 after being restarted on Eliquis . He is now representing last well 10 pm last night and with more severe aphasia and R facial droop today.      Exam 6/29 am: alert, does not answer questions, follows some simple commands. Expressive> receptive aphasia. Gaze intact. R facial droop. All extremities 5/5. Sensation intact.       CT brain 6/29: subtle area of possible early stroke in L frontal operculum. Stable L frontal/cerebellar infarcts.  CTA head/neck 6/29: persistent focal stenosis vs occlusion in L M2 branch. Otherwise stable athero from before.      MRI brain 6/27 with punctate stroke in L insula and possible L frontal. Old bilateral cerebellar infarcts.  CTA head/neck from 6/26 OSH reviewed: L M2 branch occlusion, no other areas of high grade stenosis.     From last admission:  LDL 164, A1c 9.3, TTE EF 70%, mild biaatrial dilation, early shunting suggesting of PFO. Mitral, tricuspid, and aortic regurgitation     Etiology of stroke likely from Afib. Now with likely recurrent embolus although no new LVO  Recommendations:  - admit to neurology stroke  - NPO until speech eval  - permissive HTN up to 220 systsolic  - repeat MRI brain and echo  - will hold Eliquis  until MRI brain; can start ASA today while anticoagulation held  - continue telemetry  - continue other PTA meds  - PT/OT/ST     Daniel Russom, MD

## 2024-03-06 NOTE — Progress Notes
 SPEECH-LANGUAGE PATHOLOGY  CLINICAL SWALLOW ASSESSMENT     Name: [name]   MRN: [id number]     DOB: [birth year]      Age: 63 y.o.  Admission Date: 03/06/2024     LOS: 0 days     Date of Service: 03/06/2024    Evaluation Summary   Clinical swallow evaluation completed. No s/s of aspiration appreciated. Mild oral residue appreciated w/ regular solids however clears w/ verbal cues. Discussed results/recommendations w/ primary team via Voalte and RN. This department will continue to follow up for speech-language evaluation and for diet check.  Clinical Impression: Mild dysphagia  Sources of Dysphagia: Weakness from recent CVA    Swallow Recommendations  PO: Regular, Thin liquids  Medications: As tolerated  Supervision: 1:1  Positioning: Upright as tolerated  Swallow Strategies: 100% supervision to provide cues  for swallow strategies during meals, Small bites/sips, Slow rate of intake, Check for pocketing following oral intake, provide verbal cues to clear oral residue when present  Oral Hygiene: Complete oral care to minimize the risk of aspirating oral bacteria    PO Presentation  Presentations: Therapist Fed, Patient Fed Self  Thin Liquid: Straw, Consecutive Swallows, 3 oz  Other Consistencies: Puree, Soft and bite-sized, Regular solids    Clinical Interpretation of Oral Stage  Withdraw Bolus: WFL  Form Bolus: Slowed, Incoordinated  Masticate Bolus:  (mildly prolonged but appears functional)  Transfer Bolus: WFL  Anterior Bolus Spillage: None  Oral Residue: Mild, On right (clears w/ verbal cues)  Improvement Observed With: Swallow strategies    Clinical Interpretation of Pharyngeal Stage  Swallow Initiation:  (present)  Laryngeal Elevation:  (palpable/present)  Signs / Symptoms Of Aspiration: No  Pharyngeal Stage Summary: Of note, the pt passed the 3oz water  challenge, which is a clinical tool used to screen for the presence of silent aspiration.    Oral Mech Exam  Oral Mech WFL: No  Lips: Impaired ROM - R  Tongue: Impaired Strength - R  Buccal: Impaired Sensation - R  Jaw: WFL  Velopharynx: WFL  Vocal Quality: WFL    Attempted Swallow Strategies  Small bites/sips- effective  Slow rate of intake- effective  Providing verbal cues to clear oral residue- effective    Subjective  Significant Hospital Events: Harjot Dibello is a 63 y.o. male with a PMH of PTSD, EtOH abuse, HTN, CAD, Afib w/ RVR, PE on eliquis , spontaneous pneumothorax, Hodgkin's Lymphoma and T2DM with recent admission through 6/28 for left MCA territory small ischemic stroke, presenting with acute worsening of aphasia and facial droop.  Per EMS, patient discharged home with significant improvement with aphasia and after last known well at 8 AM this morning patient developed significant difficulty expressing language.  States he also developed a right-sided facial droop which was not present on discharge.  Denies any clear new sensory or motor deficits of the extremities.  States the patient is prescribed Eliquis  but family states the patient had not been taking it since discharge from the hospital.  Mental / Cognitive: Alert, Oriented, Cooperative, Follows commands  Pain: No complaint of pain    Imaging  CTA HEAD WO/W CONT  Result Date: 03/06/2024  CTA head: 1.  Stable focal severe stenosis or partial occlusion of a left frontal opercular M2 branch vessel with corresponding left frontal opercular oligemia. 2.  Intracranial atherosclerotic vascular disease without additional high-grade central stenosis or large vessel occlusion. CTA neck: 1.  Atherosclerotic vascular disease resulting in at least mild multifocal luminal stenosis of the  subclavian arteries. Patent cervical carotid and vertebral arterial vasculature without high-grade stenosis or occlusion. 2.  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 3.  Heterogeneous left thyroid nodule which meets size criteria for dedicated ultrasound follow-up, if not previously performed. 4.  Small bilateral pleural effusions. Dr. Saunders discussed these findings with Lorrene Crews, APRN by telephone at 10:15 AM on 03/06/2024. By my electronic signature, I attest that I have personally reviewed the images for this examination and formulated the interpretations and opinions expressed in this report  Finalized by Rockey Saunders, DO on 03/06/2024 10:50 AM. Dictated by Duwaine Gal, MD on 03/06/2024 10:11 AM.    CT HEAD WO CONTRAST  Result Date: 03/06/2024  1.  Subtle area of parenchymal low-attenuation, gray-white matter differentiation, and sulcal compression along the left frontal operculum, suspect for developing acute or early subacute infarct. 2.  Stable chronic left lateral frontal cortical and cerebellar infarcts. 3.  Stable moderate patchy supratentorial white matter hypodensities, likely sequelae of chronic microvascular ischemia. Dr. Gal discussed these findings with Dr. Christa by telephone at 10:04 AM on 03/06/2024. By my electronic signature, I attest that I have personally reviewed the images for this examination and formulated the interpretations and opinions expressed in this report  Finalized by Rockey Saunders, DO on 03/06/2024 10:41 AM. Dictated by Duwaine Gal, MD on 03/06/2024 10:01 AM.    MRI HEAD WO CONTRAST  Result Date: 03/04/2024  1. Punctate focus of acute infarct deep to the anterior left insula. Additional questionable tiny area of diffusion restriction at the left frontal cortex. Otherwise no significant diffusion abnormality to suggest additional acute infarct elsewhere 2. Old bilateral cerebellar infarcts. 3. Moderate chronic cerebral microvascular ischemic changes. Findings were discussed with Dr. Alexa by phone at 8:46 AM by Dr. Ezekiel.  Finalized by Oretha Ezekiel, M.D. on 03/04/2024 8:48 AM. Dictated by Oretha Ezekiel, M.D. on 03/04/2024 7:09 AM.    CHEST SINGLE VIEW  Result Date: 03/04/2024  No acute cardiopulmonary abnormality. By my electronic signature, I attest that I have personally reviewed the images for this examination and formulated the interpretations and opinions expressed in this report  Finalized by Kaitlin Marquis, M.D. on 03/04/2024 3:27 PM. Dictated by Duwaine Gal, MD on 03/04/2024 2:56 PM.    Nutrition  Nutrition Prior To Hospitalization: Oral, Regular, Thin Liquids  Current Form Of Nutrition: NPO    Education  Persons Educated: Patient  Barriers To Learning: Impaired Communication  Interventions: Repetition of Instructions, Staff Educated  Teaching Methods: Verbal  Topics: Dysphagia, Aphasia  Patient Response: Verbalized Understanding  Goal Formulation: With Patient    Assessment/Prognosis  Plan: 2-3 x/week  Prognosis: Good  NOMS Dysphagia Rating: 5-Mild Dysphagia -Swallow safe w/ min diet restriction &/or occasionally requires min cues to use compensatory strategies. May occasionally self-cue. All nutrition/hydration needs met by mouth at mealtime.    Clinical Swallow Goals  Goal : The pt will tolerate the least restrictive diet free from adverse pulmonary change  Goal : The pt will participate in a speech-language evaluation w/ <5 cues for command following    Therapist:Eian Vandervelden Almond, MA, CF-SLP  Date:03/06/2024

## 2024-03-06 NOTE — Acute Stroke Response
 Name: [name]   MRN: [id number]     DOB: 1961/02/20      Age: 63 y.o.  Admission Date: 03/06/2024     LOS: 0 days     Date of Service: 03/06/2024       Allergies:  Morphine    Type of Acute Stroke Response Team note: H&P    Stroke Activation Tier: Tier 1    Chief Complaint: speech difficulty    Assessment: [name] is a 63 y.o. male with Afib (on Eliquis ), CAD, DM2, HTN, PE and recent L Insular punctate stroke (received IV TNK as Eliquis  had previously been held) in setting of L M2 occlusion (no EVT due to mild symptoms) on 6/26 and discharged from [facility] on 6/28, now presenting with aphasia and R facial droop with last known well at 10pm on 6/28.    NIHSS 7 for LOC questions, LOC commands R facial droop, mixed aphasia, and dysarthria.    CT head: Low-attenuation, gray-white matter differentiation, and sulcal compression along the left frontal operculum, suspect for developing acute or early subacute infarct     CTA head and neck: Stable focal severe stenosis or partial occlusion of a left frontal opercular M2 branch vessel. Scattered intracranial stenosis without significant stenosis. Heterogeneous left thyroid nodule.    SBP 140-150, afebrile, HR 88    Impression: Acute ischemic stroke    Suspected localization of Stroke Sx: L MCA    Suspected etiology: Cardio Embolism     Pre-event Modified Rankin Scale (mRS): 1 - No significant disability despite symptoms; able to carry out all usual duties and activities     Plan:   - Not eligible for IV thrombolytic due to recent stroke and Eliquis  use  - Admit to neurology stroke service on telemetry   - MRI head without contrast  - Stroke risk factor assessment    Echocardiogram    Telemetry to monitor for arrhythmia    Evaluation of smoking history and smoking cessation consult if tobaccoism present    Antiplt therapy: Hold Eliquis  until stroke burden known  - CBC, BMP routine   - Restart home meds except Eliquis  as outlined above  - PT/OT/SLP consult eval and treat   - Rehab consult for assessment of post stroke care   - NPO until speech eval, NS @ 84ml/hr as pt received contrast media   - SCDs for DVT prevention       The patient was seen and discussed with Dr. Slavin    I spent 50 minutes of critical care time managing the care of this patient. [name] is critically ill with an acute stroke, his care included: preparing to see the patient, detailed neurologic and systems exam including NIHSS and reflex exam, obtaining and/or reviewing separately obtained history, medication review, laboratory data review and interpretation, review of available and obtained imaging, referring and communication with other health care professionals (when not separately reported), documenting clinical information in the electronic or other health record and Independently interpreting results (not separately reported) and communicating results to the patient/family/caregiver.     Lorrene Crews, APRN-NP  Vascular Neurology  [facility] [employer]     History of Present Illness  [name] is a 63 year old male who was reportedly in his typical state of health around 10 PM on 6/28.  Patient's daughter found him this morning with right facial droop and severe aphasia.  EMS was called and the patient was brought to [place] for further evaluation.  CT head found possible  new ischemic stroke in the left frontal operculum region.  CTA of his known left M2 occlusion versus stenosis appears improved from his previous imaging with some distal reconstitution.    Mr. Mendell was recently admitted to [facility] after experiencing acute onset aphasia had 5 weeks of left M2 occlusion.  This was on 6/26.  He did receive IV tenecteplase at that time as his Eliquis  had been on hold since 623 in advance of an EGD procedure.  MRI of the head found out a tiny punctate infarct in the left insular region.  He was ultimately discharged on 6/28 as he had no therapy needs.  His Eliquis  was restarted prior to discharge with a plan for bridging with Lovenox prior to any further procedural plans that would necessitate stopping his Eliquis .  We were unable to obtain review of systems due to the patient's condition.    Review of Systems    Review of systems not obtained from patient due to patient factors.        Stroke Activation Summary    Patient Arrival: [phone]  ASRT Arrival: [phone] (ETA 15 min)  Location of Response : ED   Page Received: [phone]    Clinical Presentation: Aphasia, Right facial droop    Signs & Symptoms: Last Known Well  Onset of Symptoms - Date: 03/05/24  Onset of Symptoms - Time: 2000  Last Known Well - Date: 03/05/24  Last Known Well - Time: 2000     CT/CTP/CTA:        BP: 161/74 (06/28 1200)  Temp: 36.9 ?C (98.4 ?F) (06/28 1200)  Pulse: 98 (06/28 1300)  Respirations: 24 PER MINUTE (06/28 1300)  O2 Device: None (Room air) (06/28 1300)    NIHSS Completed at: 0955      NIH Stroke Scale Item  Scoring Definition  Score    1a. LOC  0=alert and responsive   1=arousable to minor stimulation   2=arousable only to painful stimulation   3=reflex responses or unrousable  0    1b. LOC questions-as patient?s age and month. Must be exact.  0=both correct   1=one correct (or dysarthria, intubated, foreign language)   2=neither correct  2    1c. Commands-open/close eyes, grip and release non-paretic hand (other 1 step commands or mimic OK)  0=both correct (ok if impaired by weakness)   1=one correct   2=neither correct  1    2. Best Gaze-horizontal EOM by voluntary or Doll?s  0=normal   1=partial gaze palsy (abnormal gaze in one or both eyes)   2=forced eye deviation or total paresis which cannot be overcome by Doll?s  0    3. Visual Field-use visual threat if necessary. If monocular, score field of good eye  0=no visual loss   1=partial hemianopia, quadrantanopia, extinction   2=complete hemianopia   3=bilateral hemianopia or blindness  0    4. Facial Palsy-if stuporous, check symmetry of grimace to pain  0=normal   1=minor paralysis, flat NLF, asymm smile   2=partial paralysis (lower face=UMN)   3=complete paralysis (upper and lower face)  1    5. Motor Arm-arms outstretched 90 deg (sitting) or 45 deg (supine) for 10 seconds. Encourage best effort.  0=no drift x 10 seconds   1=drift but doesn?t hit bed   2=some antigravity effort, but can?t sustain   3=no antigravity effort, but even minimal mvt counts   4=no movement at all   X=unable to assess due to amputation, fusion, etc  L/R   0/0    6. Motor Leg-raise leg to 30 degrees supine x 5 seconds  0=no drift x 5 seconds   1=drift but doesn?t hit bed   2=some antigravity effort, but can?t sustain   3=no antigravity effort, but even minimal mvt counts   4=no movement at all   X=unable to assess due to amputation, fusion, etc  L/R   0/0    7. Limb Ataxia-check finger-nose- finger; heel-shin; and score only if out of proportion to paralysis  0=no ataxia (or aphasic, hemiplegic)   1=ataxia in upper or lower extremity   2=ataxia in upper AND lower extremity   X=unable to assess due to amputation, fusion, etc  0   8. Sensory-use safety pin. Check grimace or withdrawal if stuporous. Score only stroke- related losses  0=normal   1=mild-mod unilateral loss but patient aware of touch 9or aphasic, confused)   2=total loss, pt unaware of touch. Coma, bilateral loss  0   9. Best Language-describe cookie jar picture, name objects, read sentences. May use repeating, writing, stereognosis  0=normal   1=mild-mod aphasia (diff but partly comprehensible)   2=severe aphasia (almost no info exchanged)   3=mute, global aphasia, coma. No 1 step commands  2   10. Dysarthria-read list of words  0=normal or intubated or other physical barrier  1=mild-mod; slurred but intelligible   2=severe; unintelligible or mute  1    11. Extinction/Neglect- simultaneously touch patient on both hands, show fingers in both visual fields, ask about deficit, left hand  0=normal, none detected. (visual loss alone)   1=neglects or extinguishes to double stimulation in any modality   2=profound neglect in more than one modality  0    Score    7     Was the patient 4.5-9 hrs from last known well or a wake-up stroke? No    Was IV tenecteplase given? No    The patient was not a thrombolytic candidate due to Use of thrombin inhibitors or direct Xa inhibitors in past 48 hours    Advanced imaging was interpreted at 1015  The patient was not a thrombectomy candidate due to no acute LVO     Dysphagia screen:                      Failed screen by nursing staff and awaiting ST evaluation     Cardiac rhythm on presentation: SR         Health History    Past Medical History:    Acid reflux    Allergy    Anemia    Atrial fibrillation (CMS-HCC)    CAD (coronary artery disease)    CHF (congestive heart failure) (CMS-HCC)    Diabetes (CMS-HCC)    GERD (gastroesophageal reflux disease)    Heart valve problem    Hodgkin's lymphoma (CMS-HCC)    Hx pulmonary embolism    Hypertension    Neuropathy    Past myocardial infarction    PTSD (post-traumatic stress disorder)    PVD (peripheral vascular disease)    Type II diabetes mellitus (CMS-HCC)     Surgical History:   Procedure Laterality Date    CARDIAC CATHERIZATION  09/2021    DES to Diag LAD    ESOPHAGOGASTRODUODENOSCOPY WITH ENDOSCOPIC MUCOSAL RESECTION - FLEXIBLE N/A 11/13/2023    Performed by Rogers Kipper, MD at [facility] ENDO    ESOPHAGOGASTRODUODENOSCOPY WITH BIOPSY - FLEXIBLE N/A 11/13/2023    Performed by Rogers Kipper, MD  at [facility] ENDO    ABDOMINAL EXPLORATION SURGERY  1981    Hodgkins    ATRIAL ABLATION SURGERY      COLONOSCOPY      ESOPHAGEAL DILATATION      GASTROSTOMY TUBE PLACEMENT      HX APPENDECTOMY  1981    HX CHOLECYSTECTOMY      HX ENDOSCOPY  1995    Partial pancreas    PANCREATECTOMY      PERICARDIUM SURGERY      UPPER GASTROINTESTINAL ENDOSCOPY       Family History   Problem Relation Name Age of Onset    Cancer-Colon Mother Father     Cancer-Colon Father Father     Company secretary Brother Brother      Social History Socioeconomic History    Marital status: Single   Tobacco Use    Smoking status: Never    Smokeless tobacco: Never   Vaping Use    Vaping status: Never Used   Substance and Sexual Activity    Alcohol use: Not Currently     Comment: last drink fall 2024    Drug use: Never    Sexual activity: Not Currently              Medications:      PRN Medications:         Physical Exam    HEENT: normocephalic, eyes open with no discharge, nares patent, oropharynx is clear with no lesions, palate intact  CV: regular rate, distal pulses palpable  Chest: normal configuration, equal chest rise bilaterally  Ab: soft, non-tender, no masses, no organomegaly  Skin: no rashes or lesions    Extended Neuro Exam:    Mental status: alert, flat affect, unable to test orientation  Speech:    Normal Abnormal   Fluency  x   Comprehension  x   Articulation  x   Repetition  x   Naming  x       Cranial Nerves:    Normal Abnormal   II x    III, IV, VI x    V x    VII                         x                             VIII x R facial droop   IX, X x    XI x    XII x        Muscle/motor:   Tone: nml  Bulk: nml  Fasciculations: none  Pronator drift: none     NF NE SA EF EE WE WF FF FE FA TA HF HA HE KF KE DF PF   R 5 5 5 5 5   5 5   5   5 5 5 5    L   5 5 5   5 5   5   5 5 5 5        Sensation:    Normal RUE LUE RLE LLE   Light Touch x       Pin Prick x       Temperature x       Vibration        Proprioception            Coordination:    Normal Abnormal Right Abnormal Left   Finger to  Nose x     Rapid alternating  x     Heel to Shin x     Finger tap      Foot tap        Gait and Station:  Regular gait: normal  Romberg: not present    Reflexes:    Right Left   Triceps     Biceps 2 2   Brachioradialis 1 1   Patella 2 2   Ankle 1 1   Plantar down down          Lab/Radiology/Other Diagnostic Tests:  Hematology:    Lab Results   Component Value Date    HGB 12.0 03/06/2024    HGB 10.7 01/23/2021    HCT 35.1 03/06/2024    HCT 31.5 01/23/2021    PLTCT 357 03/06/2024    PLTCT 460 01/23/2021    WBC 9.60 03/06/2024    WBC 7.2 01/23/2021    NEUT 67.4 03/05/2024    NEUT 61 01/21/2021    ANC 6.30 03/05/2024    ANC 5.13 01/21/2021    ALC 1.70 03/05/2024    ALC 2.23 01/21/2021    MONA 11.4 03/05/2024    MONA 10 01/21/2021    AMC 1.10 03/05/2024    AMC 0.83 01/21/2021    EOSA 2.3 03/05/2024    EOSA 1 01/21/2021    ABC 0.10 03/05/2024    ABC 0.19 01/21/2021    MCV 88.6 03/06/2024    MCV 92.3 01/23/2021    MCH 30.3 03/06/2024    MCH 31.5 01/23/2021    MCHC 34.2 03/06/2024    MCHC 34.1 01/23/2021    MPV 7.3 03/06/2024    MPV 7.4 01/23/2021    RDW 14.8 03/06/2024    RDW 15.4 01/23/2021   , General Chemistry:    Lab Results   Component Value Date    NA 135 03/06/2024    NA 138 01/23/2021    K 4.0 03/06/2024    K 3.8 01/23/2021    CL 96 03/06/2024    CL 102 01/23/2021    CO2 27 03/06/2024    CO2 27 01/23/2021    GAP 12 03/06/2024    GAP 9 01/23/2021    BUN 7 03/06/2024    BUN 9 01/23/2021    CR 0.87 03/06/2024    CR 0.8 03/06/2024    CR 0.69 01/23/2021    GLU 144 03/06/2024    GLU 131 01/23/2021    CA 9.3 03/06/2024    CA 8.4 01/23/2021    ALBUMIN 4.1 03/06/2024    ALBUMIN 3.2 01/23/2021    OBSCA 0.81 03/05/2024    MG 2.2 03/05/2024    MG 1.5 01/23/2021    TOTBILI 1.2 03/06/2024    TOTBILI 0.5 01/23/2021    PO4 2.5 03/05/2024   , HgbA1C:   Lab Results   Component Value Date    HGBA1C 9.3 03/03/2024    HGBA1C 7.3 01/21/2021   , Lipid Profile:   Lab Results   Component Value Date    CHOL 233 03/03/2024    CHOL 220 01/21/2021    TRIG 168 03/03/2024    TRIG 79 01/21/2021    HDL 47 03/03/2024    HDL 70 01/21/2021    LDL 164 03/03/2024    LDL 849 01/21/2021    VLDL 33.6 03/03/2024    VLDL 16 01/21/2021        Pertinent radiology reviewed.    CTA head:   1.  Stable  focal severe stenosis or partial occlusion of a left frontal opercular M2 branch vessel with corresponding left frontal opercular oligemia.   2. Intracranial atherosclerotic vascular disease without additional high-grade central stenosis or large vessel occlusion.     CTA neck:   1.  Atherosclerotic vascular disease resulting in at least mild multifocal luminal stenosis of the subclavian arteries. Patent cervical carotid and vertebral arterial vasculature without high-grade stenosis or occlusion.   2.  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   3.  Heterogeneous left thyroid nodule which meets size criteria for dedicated ultrasound follow-up, if not previously performed.   4.  Small bilateral pleural effusions.

## 2024-03-06 NOTE — Progress Notes
 CA7 END OF SHIFT/ JHFRAT NOTE    Admission Date: 03/06/2024    Acute events, interventions, provider communication: New admit    Patient Interventions and Education  Fall Risk/JHFRAT Interventions and Education: (Charting when applicable)  Elimination Interventions : Place male/male urinal within reach   Medications : Stay within arm's reach during toileting/showering (i.e., dizziness, orthostasis)   Patient Care Equipment: Needs assistance with patient care equipment when ambulating  Mobility: Assist x1 and Elimination equipment at bedside (urinal or commode)   Cognition: Stay within arm's reach while patient ambulating/toileting/showering  Risk for Moderate/Major Injury: N/A    2. Restraints:  No     Restraint Goal: Patient will be free from injury while physically restrained.  See Docflowsheet for restraint documentation, interventions, education, etc.      Intake and Output:     No intake or output data in the 24 hours ending 03/06/24 1850           Last Bowel Movement Date: 03/05/24

## 2024-03-06 NOTE — Care Plan
 Problem: Discharge Planning  Goal: Participation in plan of care  Outcome: Goal Ongoing  Goal: Knowledge regarding plan of care  Outcome: Goal Ongoing  Goal: Prepared for discharge  Outcome: Goal Ongoing     Problem: Glucose Management  Goal: Absence of hyperglycemia  Outcome: Goal Ongoing  Goal: Absence of Hypoglycemia  Outcome: Goal Ongoing  Goal: Glucose level within specified parameters  Outcome: Goal Ongoing     Problem: Neurological Status, Impaired/Altered  Goal: Progress toward maximizing functional outcomes  Outcome: Goal Ongoing  Goal: Cognitive status restored to baseline  Outcome: Goal Ongoing     Problem: Mobility/Activity Intolerance  Goal: Maximize functional ADL's and mobility outcomes  Outcome: Goal Ongoing     Problem: VTE, Risk of  Goal: Absence of venous thrombosis  Outcome: Goal Ongoing  Goal: Knowledge of warfarin regimen  Outcome: Goal Ongoing

## 2024-03-06 NOTE — ED Notes
 Pt is an AO, aphasic, 63yo M who presents to the ED via EMS with reports of right sided facial droop that began at 2000 last night. Pt had stroke on 6/26 and deficit from that is the aphasia. Pt denies any pain, CP, SOA. Pt taken directly to the CT scanner upon arrival for further evaluation.     Pt to remain NPO due to aphasia.     NIH per ASRT RN= 6

## 2024-03-06 NOTE — Acute Stroke Response
 [name] Stroke Activation Summary    Date of Service:  03/06/2024  [name] is a 63 y.o.  male.     DOB: 1961-08-28                  MRN#:  [id number]  Allergies:  Morphine    Patient Arrival: 0952  ASRT Arrival: 0939 (ETA 15 min)  Location of Response : ED    Page Received: [phone]  EMS Agency: [facility]  Outside Hospital: NA    Clinical Presentation: Aphasia, Right facial droop    Total Stroke Scale Score: 6    Signs & Symptoms: Last Known Well  Onset of Symptoms - Date: 03/05/24  Onset of Symptoms - Time: 2000  Last Known Well - Date: 03/05/24  Last Known Well - Time: 2000     Dysphagia screen:  Step 1: Does the Patient Have any of the Following Exclusions? : Slurred speech  STOP Screen and Enter Provider Notified: Stroke/ ED                Plan:    Summary:  Patient not a candidate for acute stroke intervention at this time.  Please see Acute Stroke Response Provider Note for further information.     Radiology/Interventional Delay  Decision to IR: No  Delays: No      No current facility-administered medications for this encounter.    N/A  IV Thrombolytic in the Scanner: No    Plan: Admit for observation         [name] handoff: [name], [name]      [name], [name]

## 2024-03-07 ENCOUNTER — Inpatient Hospital Stay: Admit: 2024-03-07 | Discharge: 2024-03-07 | Payer: BLUE CROSS/BLUE SHIELD

## 2024-03-07 ENCOUNTER — Inpatient Hospital Stay
Admit: 2024-03-06 | Discharge: 2024-03-07 | Disposition: A | Discharge status: Home / Self Care | Payer: BLUE CROSS/BLUE SHIELD | Admitting: Neurology

## 2024-03-07 ENCOUNTER — Encounter: Admit: 2024-03-07 | Discharge: 2024-03-07 | Payer: BLUE CROSS/BLUE SHIELD

## 2024-03-07 DIAGNOSIS — I252 Old myocardial infarction: Secondary | ICD-10-CM

## 2024-03-07 DIAGNOSIS — I110 Hypertensive heart disease with heart failure: Secondary | ICD-10-CM

## 2024-03-07 DIAGNOSIS — I340 Nonrheumatic mitral (valve) insufficiency: Secondary | ICD-10-CM

## 2024-03-07 DIAGNOSIS — Z9049 Acquired absence of other specified parts of digestive tract: Secondary | ICD-10-CM

## 2024-03-07 DIAGNOSIS — Z8571 Personal history of Hodgkin lymphoma: Secondary | ICD-10-CM

## 2024-03-07 DIAGNOSIS — F4310 Post-traumatic stress disorder, unspecified: Secondary | ICD-10-CM

## 2024-03-07 DIAGNOSIS — I351 Nonrheumatic aortic (valve) insufficiency: Secondary | ICD-10-CM

## 2024-03-07 DIAGNOSIS — E041 Nontoxic single thyroid nodule: Secondary | ICD-10-CM

## 2024-03-07 DIAGNOSIS — I4891 Unspecified atrial fibrillation: Secondary | ICD-10-CM

## 2024-03-07 DIAGNOSIS — Z86711 Personal history of pulmonary embolism: Secondary | ICD-10-CM

## 2024-03-07 DIAGNOSIS — Z800 Family history of malignant neoplasm of digestive organs: Secondary | ICD-10-CM

## 2024-03-07 DIAGNOSIS — I361 Nonrheumatic tricuspid (valve) insufficiency: Secondary | ICD-10-CM

## 2024-03-07 DIAGNOSIS — E1151 Type 2 diabetes mellitus with diabetic peripheral angiopathy without gangrene: Secondary | ICD-10-CM

## 2024-03-07 DIAGNOSIS — E1140 Type 2 diabetes mellitus with diabetic neuropathy, unspecified: Secondary | ICD-10-CM

## 2024-03-07 DIAGNOSIS — I509 Heart failure, unspecified: Secondary | ICD-10-CM

## 2024-03-07 DIAGNOSIS — I63512 Cerebral infarction due to unspecified occlusion or stenosis of left middle cerebral artery: Principal | ICD-10-CM

## 2024-03-07 DIAGNOSIS — I2510 Atherosclerotic heart disease of native coronary artery without angina pectoris: Secondary | ICD-10-CM

## 2024-03-07 DIAGNOSIS — R29707 NIHSS score 7: Secondary | ICD-10-CM

## 2024-03-07 DIAGNOSIS — R4701 Aphasia: Secondary | ICD-10-CM

## 2024-03-07 DIAGNOSIS — E782 Mixed hyperlipidemia: Secondary | ICD-10-CM

## 2024-03-07 DIAGNOSIS — Z7901 Long term (current) use of anticoagulants: Secondary | ICD-10-CM

## 2024-03-07 LAB — LIMITED ECHO
BSA: 2 m2
LEFT VENTRICLE DIASTOLIC VOLUME INDEX: 58 mL/m2 (ref 34–74)
LEFT VENTRICLE DIASTOLIC VOLUME: 118 mL (ref 62–150)
LEFT VENTRICLE SYSTOLIC VOLUME: 29 mL/m2 (ref 21–61)
RA PRESSURE: 3
SIMPSONS BIPLANE EF: 75 %

## 2024-03-07 LAB — POC GLUCOSE
~~LOC~~ BKR POC GLUCOSE: 126 mg/dL — ABNORMAL HIGH (ref 70–100)
~~LOC~~ BKR POC GLUCOSE: 213 mg/dL — ABNORMAL HIGH (ref 70–100)

## 2024-03-07 MED ORDER — METOPROLOL TARTRATE 25 MG PO TAB
12.5 mg | ORAL_TABLET | Freq: Two times a day (BID) | ORAL | 1 refills | 90.00000 days | Status: DC
Start: 2024-03-07 — End: 2024-03-07

## 2024-03-07 MED ORDER — METOPROLOL TARTRATE 25 MG PO TAB
12.5 mg | ORAL_TABLET | Freq: Two times a day (BID) | ORAL | 1 refills | 90.00000 days | Status: AC
Start: 2024-03-07 — End: ?
  Filled 2024-03-07: qty 30, 30d supply, fill #0

## 2024-03-07 MED ORDER — EZETIMIBE 10 MG PO TAB
10 mg | ORAL_TABLET | Freq: Every day | ORAL | 5 refills | 90.00000 days | Status: AC
Start: 2024-03-07 — End: ?
  Filled 2024-03-07: qty 30, 30d supply, fill #0

## 2024-03-07 MED ORDER — ATORVASTATIN 40 MG PO TAB
80 mg | Freq: Every day | ORAL | 0 refills | Status: DC
Start: 2024-03-07 — End: 2024-03-07
  Administered 2024-03-07: 15:00:00 80 mg via ORAL

## 2024-03-07 MED ORDER — ROSUVASTATIN 20 MG PO TAB
40 mg | Freq: Every day | ORAL | 0 refills | Status: DC
Start: 2024-03-07 — End: 2024-03-07

## 2024-03-07 MED ORDER — SODIUM CHLORIDE 0.9 % IJ SOLN
10 mL | Freq: Once | INTRAVENOUS | 0 refills | Status: CP
Start: 2024-03-07 — End: ?
  Administered 2024-03-07: 18:00:00 10 mL via INTRAVENOUS

## 2024-03-07 MED ORDER — NIACIN 500 MG PO TB24
500 mg | ORAL_TABLET | Freq: Every evening | ORAL | 5 refills | Status: DC
Start: 2024-03-07 — End: 2024-03-07

## 2024-03-07 MED ORDER — PERFLUTREN LIPID MICROSPHERES 1.1 MG/ML IV SUSP
1-10 mL | Freq: Once | INTRAVENOUS | 0 refills | Status: CP | PRN
Start: 2024-03-07 — End: ?
  Administered 2024-03-07: 18:00:00 3 mL via INTRAVENOUS

## 2024-03-07 MED ORDER — APIXABAN 5 MG PO TAB
5 mg | Freq: Two times a day (BID) | ORAL | 0 refills | Status: DC
Start: 2024-03-07 — End: 2024-03-07
  Administered 2024-03-07: 15:00:00 5 mg via ORAL

## 2024-03-07 MED ORDER — NIACIN 500 MG PO TB24
500 mg | Freq: Every evening | ORAL | 0 refills | Status: DC
Start: 2024-03-07 — End: 2024-03-07

## 2024-03-07 MED ORDER — SENNOSIDES 8.6 MG PO TAB
1 | Freq: Two times a day (BID) | ORAL | 0 refills | Status: DC
Start: 2024-03-07 — End: 2024-03-07

## 2024-03-07 NOTE — Progress Notes
 [name] discharged on 03/07/2024.   SABRADischarge instructions reviewed with patient and family.  Valuables returned:   ADL Belongings at Bedside: None.  Home medications: N/A   Functional assessment at discharge complete: Yes    RN called daughter [name] for patient pickup and to provide stroke education to patient and family.

## 2024-03-07 NOTE — Case Management (ED)
 Case Management 30 Day Readmission Assessment      NAME: [name]                    MRN: [id number]              DOB: 1961-01-15          AGE: 63 y.o.  ADMISSION DATE: 03/06/2024             DAYS ADMITTED: LOS: 1 day    Today's Date: 03/07/2024    Source of Information: Patient and EMR    Expected Discharge Date:  03/07/2024 12:00 PM    Plan:  NCM reviewed EMR.  NCM attended and participated in Neuro Stroke huddle.  NCM spoke with patient to complete initial assessment. NCM offered contact information and provided explanation of NCM role. NCM provided opportunity for questions and discussion. Patient/family encouraged to contact Case Management team with questions and concerns during hospitalization and until patient is able to transition back to the patient's primary care physician.  Demographics verified with patient.  Patient confirms his assessment information is unchanged from his previous admission. Patient reports his daughter [name] will provide transportation upon discharge.  PT/OT following and not recommending any post acute service needs/DME.  Will continue to follow.    Previous Inpatient Discharge Date:    03/05/24    Previous Case Management admission assessment dated 03/04/2024 is copied at the end of this note for reference and was reviewed with patient/family.  Changes to information from previous admission assessment are: none.     Acute Hospital Stay:  Acute Hospital Stay: In the past  Was patient's stay within the last 30 days?: Yes  Name of Hospital: [facility]  When did patient receive care?: 03/03/2024 - 03/05/2024  Readmission Code Group: 7. Advancement of Disease / Chronic Illness Management  7. Advancement of Disease / Chronic Illness Management: 7a9. Neurological    Prior Living Situation/Admitted from?   Type of Residence: Home, independent    Post-Acute Services/DME Last Discharge:  Were post-acute services/DME arranged at previous disharge?: No    Lack of services:  At or after previous discharge, what did the patient/family feel they lacked?: Not applicable    Previous Discharge Resources Provided:  Were resources provided to patient at previous discharge in person or within AVS?: No    Social Determinants of Health (SDOH):                         Transportation:  Does the Patient Need Case Management to Arrange Discharge Transport? (ex: facility, ambulance, wheelchair/stretcher, Medicaid, cab, other): No  Will the Patient Use Family Transport?: Yes  Transportation Name, Phone and Availability #1: Patient's daughter Daniel  [phone] will provide transportation upon discharge.        Larraine Blumenthal, BSN, RN  Med Private R Nurse Case Manager  [phone] and available on Voalte      Case Management Admission Assessment     NAME:[name]                          MRN: [id number]             DOB:May 27, 1961          AGE: 63 y.o.  ADMISSION DATE: 03/03/2024             DAYS ADMITTED: LOS: 1 day      Today?s Date: 03/04/2024  Source of Information: Patient     H&P: [name] is a 63 y.o. male with a PMH of PTSD, EtOH abuse, HTN, CAD, Afib w/ RVR, PE on eliquis , spontaneous pneumothorax, Hodgkin's Lymphoma and T2DM who presented to [facility] when developed expressive aphasia while at work. LKW 1430 on 6/26 with an NIH of 2. Imaging demonstrated a left M2 occlusion and was given TNK at 1823. He was subsequently transferred to [facility] for further evaluation and management.       Plan  Plan: Case Management Assessment, No Further Intervention Required  This CM met with pt for assessment on this date.  Provided contact information and explanation of SW/NCM roles.  Discussed [facility], Preparing for Discharge, and [facility] hand-outs.  Provided opportunity for questions and discussion. Pt/family encouraged to contact Case Management team with questions and concerns during hospitalization and until patient is able to transition back to the patient's primary care physician.     Assessment Notes:     Pt resides in a two-level home with no steps to entry. The home accomodates single level living.     Pt lives alone in [place], [place].     Pt is independent with ADLs.     Pt is employed full time     Pt could have intermittent supervision, if needed. This would be provided by [name]     Pt utilizes no DME. Denies use of home infusion or home enteral in the past.     Pt's previous history of HH, SNF, IPR and LTACH includes: NA     PCP: Current with [name]     Rx: Manages and orders independently, meds are affordable     DC transportation will be provided by: [name]     Most recent therapy recommendations:     PT consulted--recs pending  OT consulted--recs pending  SLP consulted--recs pending  Rehab Medicine consulted--recs pending     Case Management Needs:     Anticipate return home when medically stable. Discussed potential outpatient speech therapy with patient but he feels his language has improved greatly since admission. NCM offered to add clinic information close to home address on file to AVS should he change his mind and patient was agreeable to this.     Patient Address/Phone  [address] [place] [address]  [phone] (home)      Emergency Contact  Extended Emergency Contact Information  Primary Emergency Contact: [name]  Mobile Phone: [phone]  Relation: Daughter     Healthcare Directive  Healthcare Directive: No, patient does not have a healthcare directive  Would patient like to fill out a (a new) Healthcare Directive?: No, patient declined     Transportation  Does the Patient Need Case Management to Arrange Discharge Transport? (ex: facility, ambulance, wheelchair/stretcher, Medicaid, cab, other): No  Will the Patient Use Family Transport?: Yes  Transportation Name, Phone and Availability #1: [name] [phone]     Expected Discharge Date  03/07/2024      Living Situation Prior to Admission  Living Arrangements  Type of Residence: Home, independent  Living Arrangements: Alone  Financial risk analyst / Tub: Psychologist, counselling, Tub/Shower Unit  How many levels in the residence?: 2  Can patient live on one level if needed?: Yes  Does residence have entry and/or inside stairs?: No  Assistance needed prior to admit or anticipated on discharge: No  Who provides assistance or could if needed?: [name]  Are they in good health?: Yes  Can support system provide 24/7 care if needed?: Maybe  Level of Function   Prior level of function: Independent  Cognitive Abilities   Cognitive Abilities: Alert and Oriented, Engages in problem solving and planning     Financial Resources  Coverage  Primary Insurance: Nurse, learning disability  Secondary Insurance: No insurance  Additional Coverage: None  Medication Coverage    Medication Coverage: Commercial insurance  Have you experienced a noticeable increase in your copay costs recently?: No  Are current medications affordable?: Yes  Do You Use a Co-Pay Card or a Medication Assistance Program to Help Manage Medication Costs?: No  Do You Manage Your Own Medications?: Yes  Source of Income   Source Of Income: Employed  Financial Assistance Needed?     Psychosocial Needs  Mental Health  Mental Health History: No  Substance Use History  Substance Use History Screen: No  Other     Current/Previous Services  PCP  Dovie Elsie NOVAK, [phone], [phone]  Pharmacy     [facility] [phone] [place] [place], [place] - [address] AT [facility] [address]  [address]  [place] [address]  Phone: [phone] Fax: [phone]     Durable Medical Equipment   Durable Medical Equipment at home: None  Home Health  Receiving home health: No  Hemodialysis or Peritoneal Dialysis  Undergoing hemodialysis or peritoneal dialysis: No  Tube/Enteral Feeds  Receive tube/enteral feeds: No  Infusion  Receive infusions: No  Private Duty  Private duty help used: No  Home and Community Based Services  Home and community based services: No  Ryan White  Ryan White: N/A  Hospice  Hospice: No  Outpatient Therapy  PT: No  OT: No  SLP: No  Skilled Nursing Facility/Nursing Home  SNF: No  NH: No  Inpatient Rehab  IPR: No  Long-Term Acute Care Hospital  LTACH: No  Acute Hospital Stay  Acute Hospital Stay: In the past  Was patient's stay within the last 30 days?: No

## 2024-03-07 NOTE — Progress Notes
 OCCUPATIONAL THERAPY  ASSESSMENT/DISCHARGE NOTE      Name: [name]   MRN: [id number]     DOB: [birth year]      Age: 63 y.o.  Admission Date: 03/06/2024     LOS: 1 day     Date of Service: 03/07/2024      Mobility  Patient Turn/Position: Chair  Mobility Level [facility] Highest Level of Mobility (JH-HLM): Walk 250 feet or more  Distance Walked (feet): 620 ft  Level of Assistance: Stand by assistance  Assistive Device: None  Activity Limited By: No limitations    Subjective  Significant Hospital Events: 63 y.o. male with a PMH of PTSD, EtOH abuse, HTN, CAD, Afib w/ RVR, PE on eliquis , spontaneous pneumothorax, Hodgkin's Lymphoma and T2DM with recent admission through 6/28 for left MCA territory small ischemic stroke, presenting with acute worsening of aphasia and facial droop. Per EMS, patient discharged home with significant improvement with aphasia and after last known well at 8 AM this morning patient developed significant difficulty expressing language. States he also developed a right-sided facial droop which was not present on discharge. Denies any clear new sensory or motor deficits of the extremities. States the patient is prescribed Eliquis  but family states the patient had not been taking it since discharge from the hospital.  Mental / Cognitive: Alert;Oriented;Cooperative;Follows commands  Pain: No complaint of pain  Persons Present: Nursing Staff  Comments: Upon arrival, patient in bed. After session, patient in chair, alarm on, and all needs met. RN notified.    Home Living Situation  Lives With: Family  Type of Home: House  Entry Stairs: No stairs  In-Home Stairs: Able to live on main level  Patient Owned Equipment: None    Prior Level of Function  Level Of Independence: Independent with ADL and community mobility without device  History of Falls in Past 3 Months: No    Vision  Ocular Range Of Motion: Within Normal Limits  Resting Head Position: Normal  Tracking: Within Functional Limits  Saccades: Within Functional Limits  Convergence: WFL (Within 6 Inches of Nose)  Visual Fields: Difficulty Detecting Stimulus in Left Lateral Quadrant    ADL's  Where Assessed: Standing at [employer] Assist: Modified Independent  Grooming Deficits: Supervision/Safety;Teeth Care    ADL Mobility  Bed Mobility: Supine to Sit: Independent  Transfer Type: Sit to stand  Transfer: Assistance Level: Modified independent;From;Bed  Transfer: Assistive Device: None  Transfer: Type of Assistance: For safety considerations  End of Activity Status: Up in chair;Nursing notified;Instructed patient to use call light;Instructed patient to request assist with mobility  Gait Distance: 620 feet  Gait: Assistance Level: Modified independent;Safety considerations;Management of lines  Gait: Assistive Device: None  Stairs: Number Climbed: 3  Stairs: Assistance Level: Standby assist  Stairs: Assistive Device: One rail    Activity Tolerance  Endurance: 4/5 Tolerates 30+ Minutes Exercise W/O Fatigue    Cognition  Expression: Expressive Aphasia  Attention: Awake/Alert  Cognition Comment: Expressive communication deficit. Appears receptive language in tact.    ROM  R UE ROM: WFL   R UE ROM Method: Active  L UE ROM: WFL   L UE ROM Method: Active  Coordination: Serial Opposition WNL for Rate, Rhythm, Placement  Grasp: Bilateral Grasp Functional for Activity    Upper extremity outcome measure testing not completed due to patients upper extremities are at baseline function.     Sensory  Overall Sensory: Pt Perceives Pressure in Both UEs in Gross Exam    Strength /  Tone  R UE Strength: WFL  L UE Strength: WFL    Education  Persons Educated: Patient  Teaching Methods: Verbal Instruction  Patient Response: Verbalized Understanding  Topics: Role of OT, Goals for Therapy  Goal Formulation: With Patient    Assessment  Prognosis: Good;w/ Family  Goal Formulation: Patient  No Skilled OT: No Acute OT Goals Identified    AM-PAC 6 Clicks Daily Activity Inpatient  Putting on and taking off regular lower body clothes: None  Bathing (Including washing, rinsing, drying): None  Toileting, which includes using toilet, bedpan, or urinal: None  Putting on and taking off regular upper body clothing: None  Taking care of personal grooming such as brushing teeth: None  Eating meals: None  Daily Activity Raw Score: 24  Standardized (T-scale) Score: 57.54    Plan  OT Frequency: No Further Treatment    OT Discharge Recommendations  Recommendation: Home with intermittent supervision/assistance  Patient Currently Requires Equipment: None  Comments: Patient may need assist for communication at discharge.    Therapist: Norlene Brasil, OTR/L [phone]  Date: 03/07/2024

## 2024-03-07 NOTE — Progress Notes
 [name] discharged on 03/07/2024.   [name]  Discharge instructions reviewed with patient and family.  Valuables returned:   ADL Belongings at Bedside: None.  Home medications:    .  Functional assessment at discharge complete: Yes .    Patient discharged with all belongings via wheelchair.

## 2024-03-07 NOTE — Care Plan
 Problem: Discharge Planning  Goal: Participation in plan of care  Outcome: Goal Achieved  Goal: Knowledge regarding plan of care  Outcome: Goal Achieved  Goal: Prepared for discharge  Outcome: Goal Achieved     Problem: Glucose Management  Goal: Absence of hyperglycemia  Outcome: Goal Achieved  Goal: Absence of Hypoglycemia  Outcome: Goal Achieved  Goal: Glucose level within specified parameters  Outcome: Goal Achieved     Problem: Neurological Status, Impaired/Altered  Goal: Progress toward maximizing functional outcomes  Outcome: Goal Achieved  Goal: Cognitive status restored to baseline  Outcome: Goal Achieved     Problem: Mobility/Activity Intolerance  Goal: Maximize functional ADL's and mobility outcomes  Outcome: Goal Achieved     Problem: VTE, Risk of  Goal: Absence of venous thrombosis  Outcome: Goal Achieved  Goal: Knowledge of warfarin regimen  Outcome: Goal Achieved

## 2024-03-07 NOTE — Progress Notes
 PHYSICAL THERAPY  NOTE      Name: [name]   MRN: [id number]     DOB: 09-Oct-1960      Age: 63 y.o.  Admission Date: 03/06/2024     LOS: 1 day     Date of Service: 03/07/2024      Per discussion with OT, patient reports no concerns with mobility with no PT goals identified. PT will discontinue service, please re-consult if the patient has a decline in functional status.      Therapist: Zamyiah Tino, PT  Date: 03/07/2024

## 2024-03-08 ENCOUNTER — Encounter: Admit: 2024-03-08 | Discharge: 2024-03-08 | Payer: BLUE CROSS/BLUE SHIELD

## 2024-03-09 ENCOUNTER — Encounter: Admit: 2024-03-09 | Discharge: 2024-03-09 | Payer: BLUE CROSS/BLUE SHIELD

## 2024-03-09 NOTE — Telephone Encounter
 Yes, please prescribe Niacin 250 mg qhs X 1 week then increase to 500 mg qhs. Can f/u with PCP on further titration of this. Thanks!     Received above message from Dr. Slavin. Unable to find Niacin or Vit. B3 in medication list. Routing to Dr. Slavin for further instruction.

## 2024-03-14 ENCOUNTER — Encounter: Admit: 2024-03-14 | Discharge: 2024-03-14 | Payer: BLUE CROSS/BLUE SHIELD

## 2024-03-14 MED ORDER — NIACIN 500 MG PO TAB
500 mg | ORAL_TABLET | Freq: Every evening | ORAL | 0 refills | Status: CN
Start: 2024-03-14 — End: ?

## 2024-03-14 MED ORDER — NIACIN 250 MG PO TAB
250 mg | ORAL_TABLET | Freq: Every evening | ORAL | 0 refills | Status: CN
Start: 2024-03-14 — End: ?

## 2024-03-21 ENCOUNTER — Encounter: Admit: 2024-03-21 | Discharge: 2024-03-21 | Payer: BLUE CROSS/BLUE SHIELD

## 2024-04-06 ENCOUNTER — Encounter: Admit: 2024-04-06 | Discharge: 2024-04-06 | Payer: BLUE CROSS/BLUE SHIELD

## 2024-04-06 DIAGNOSIS — I10 Essential (primary) hypertension: Principal | ICD-10-CM

## 2024-04-06 NOTE — Progress Notes
 CARDIAC NEW PATIENT PROFILE      PHYSICIANS INFORMATION:   REFERRING PHYSICIAN: Paulene Doyce HERO, APRN-NP    PCP: Elsie Rife, MD    REASON FOR VISIT/DIAGNOSIS:   PFO    RECENT EVENTS/SYMPTOMS:   Discharge Summary by Slavin, Sabreena J, MD (03/07/2024 14:25)   Per note, He will continue Eliquis  and follow up with cardiology outpatient for possible PFO closure seen on ECHO. Zetia  added for increased LDL of 164 in addition to PTA Crestor .     PERTINENT CARDIAC HISTORY: a fib, CAD, CHF, PFO, hypertension, PVD    OTHER MEDICAL HISTORY:   Past Medical History:    Acid reflux    Allergy    Anemia    Atrial fibrillation (CMS-HCC)    CAD (coronary artery disease)    CHF (congestive heart failure) (CMS-HCC)    Diabetes (CMS-HCC)    GERD (gastroesophageal reflux disease)    Heart valve problem    Hodgkin's lymphoma (CMS-HCC)    Hx pulmonary embolism    Hypertension    Neuropathy    Past myocardial infarction    PTSD (post-traumatic stress disorder)    PVD (peripheral vascular disease)    Type II diabetes mellitus (CMS-HCC)       MOST RECENT PERTINENT TESTING/PROCEDURES:   LIMITED ECHO (03/07/2024 13:34)   Hyperdynamic left ventricular systolic function.  Estimated EF >70%.  No segmental wall motion abnormalities.  No evidence of intracardiac thrombus.  Aortic valve is calcified.  Doppler interrogation not performed.  Moderate calcification of the mitral valve annulus.  Doppler interrogation not performed.  No pericardial effusion.    CHEST SINGLE VIEW (03/04/2024 14:26)   No acute cardiopulmonary abnormality.     US  VENOUS DOPPLER BILATERAL (03/04/2024 16:53)   No femoral or popliteal deep venous thrombosis in either leg.     2D + DOPPLER ECHO (03/04/2024 09:41)   Normal left ventricular size with hyperdynamic systolic function.  LVEF of 70% by Simpson's biplane method.  Flattening of the interventricular septum suggestive of right ventricular pressure/volume overload.  Normal right ventricular size and systolic function.  Mild biatrial dilatation.  Early right to left interatrial shunting by agitated saline contrast bubbles, suggestive of patent foramen ovale.  Sclerotic/calcified aortic valve and aortic annulus.  Mild aortic stenosis.  Mild to moderate aortic regurgitation.  Nonspecific mitral valve thickening with mild mitral stenosis.  Mean inflow gradient of 5-6 mmHg at 82 bpm.  Moderate to probably severe mitral regurgitation.  Severe tricuspid regurgitation.  Estimated CVP of 5-10 mmHg (although IVC may be incompletely visualized and CVP possibly underestimated).  Estimated PASP of 52 mmHg.  No pericardial effusion.    CT Pulmonary Embolism with Contrast (12/10/2023 02:42)   1.  No evidence of pulmonary embolism.   2.  Right lower lobe infiltrate likely pneumonia.       PERTINENT SURGERIES:   Surgical History:   Procedure Laterality Date    CARDIAC CATHERIZATION  09/2021    DES to Diag LAD    ESOPHAGOGASTRODUODENOSCOPY WITH ENDOSCOPIC MUCOSAL RESECTION - FLEXIBLE N/A 11/13/2023    Performed by Rogers Kipper, MD at [facility] ENDO    ESOPHAGOGASTRODUODENOSCOPY WITH BIOPSY - FLEXIBLE N/A 11/13/2023    Performed by Rogers Kipper, MD at [facility] ENDO    ABDOMINAL EXPLORATION SURGERY  1981    Hodgkins    ATRIAL ABLATION SURGERY      COLONOSCOPY      ESOPHAGEAL DILATATION      GASTROSTOMY TUBE PLACEMENT  HX APPENDECTOMY  1981    HX CHOLECYSTECTOMY      HX ENDOSCOPY  1995    Partial pancreas    PANCREATECTOMY      PERICARDIUM SURGERY      UPPER GASTROINTESTINAL ENDOSCOPY         PERTINENT CARDIAC FAMILY HISTORY: none noted    MEDICATIONS/ALLERGIES: up to date in chart    LAB: book marked  BASIC METABOLIC PANEL (03/07/2024 03:54)   COMPREHENSIVE METABOLIC PANEL (03/06/2024 10:02)   HEMOGLOBIN A1C (03/03/2024 21:43)   Lipid panel (08/10/2023 06:00)       IMAGES: follows internally

## 2024-05-23 ENCOUNTER — Encounter: Admit: 2024-05-23 | Discharge: 2024-05-23 | Payer: BLUE CROSS/BLUE SHIELD

## 2024-05-23 NOTE — Progress Notes
 CTA chest dated 03/06/2024 sent to PCP on file. Report recommends a thyroid ultrasound.

## 2024-07-15 ENCOUNTER — Ambulatory Visit: Admit: 2024-07-15 | Discharge: 2024-07-15 | Payer: BLUE CROSS/BLUE SHIELD

## 2024-07-15 ENCOUNTER — Encounter: Admit: 2024-07-15 | Discharge: 2024-07-15 | Payer: BLUE CROSS/BLUE SHIELD

## 2024-08-19 ENCOUNTER — Encounter: Admit: 2024-08-19 | Discharge: 2024-08-19 | Payer: BLUE CROSS/BLUE SHIELD

## 2024-09-09 ENCOUNTER — Encounter: Admit: 2024-09-09 | Discharge: 2024-09-09 | Payer: BLUE CROSS/BLUE SHIELD

## 2024-10-11 ENCOUNTER — Encounter: Admit: 2024-10-11 | Discharge: 2024-10-11 | Payer: BLUE CROSS/BLUE SHIELD
